# Patient Record
Sex: Female | Born: 1985 | Race: White | Hispanic: No | Marital: Married | Smoking: Former smoker
Health system: Southern US, Community
[De-identification: ages and names within clinical notes are randomized; demographics above are authoritative.]

## PROBLEM LIST (undated history)

## (undated) ENCOUNTER — Emergency Department: Disposition: A | Discharge status: Other Acute Inpatient Hospital | Payer: 59

## (undated) DIAGNOSIS — F419 Anxiety disorder, unspecified: Secondary | ICD-10-CM

## (undated) HISTORY — DX: Anxiety disorder, unspecified: F41.9

## (undated) HISTORY — PX: WISDOM TOOTH EXTRACTION: SHX21

## (undated) HISTORY — PX: NO PAST SURGERIES: SHX2092

---

## 2015-08-28 ENCOUNTER — Other Ambulatory Visit: Payer: Self-pay | Admitting: Internal Medicine

## 2015-08-28 ENCOUNTER — Encounter: Payer: Self-pay | Admitting: Internal Medicine

## 2015-08-28 DIAGNOSIS — Z920 Personal history of contraception: Secondary | ICD-10-CM | POA: Insufficient documentation

## 2015-08-28 DIAGNOSIS — G245 Blepharospasm: Secondary | ICD-10-CM | POA: Insufficient documentation

## 2015-08-28 DIAGNOSIS — G43909 Migraine, unspecified, not intractable, without status migrainosus: Secondary | ICD-10-CM | POA: Insufficient documentation

## 2015-08-28 DIAGNOSIS — E663 Overweight: Secondary | ICD-10-CM | POA: Insufficient documentation

## 2015-08-28 DIAGNOSIS — M79673 Pain in unspecified foot: Secondary | ICD-10-CM | POA: Insufficient documentation

## 2015-09-15 ENCOUNTER — Other Ambulatory Visit: Payer: Self-pay | Admitting: Internal Medicine

## 2016-04-27 ENCOUNTER — Other Ambulatory Visit: Payer: Self-pay | Admitting: Internal Medicine

## 2016-05-13 NOTE — Telephone Encounter (Signed)
Lm pt for pt to call back 7/20, unable to reach pt 8/2

## 2016-05-28 ENCOUNTER — Other Ambulatory Visit: Payer: Self-pay | Admitting: Internal Medicine

## 2016-06-08 ENCOUNTER — Encounter: Payer: Self-pay | Admitting: Internal Medicine

## 2016-06-08 ENCOUNTER — Ambulatory Visit (INDEPENDENT_AMBULATORY_CARE_PROVIDER_SITE_OTHER): Payer: 59 | Admitting: Internal Medicine

## 2016-06-08 VITALS — BP 122/88 | HR 88 | Resp 16 | Ht 67.0 in | Wt 244.0 lb

## 2016-06-08 DIAGNOSIS — G43009 Migraine without aura, not intractable, without status migrainosus: Secondary | ICD-10-CM | POA: Diagnosis not present

## 2016-06-08 DIAGNOSIS — M722 Plantar fascial fibromatosis: Secondary | ICD-10-CM | POA: Insufficient documentation

## 2016-06-08 MED ORDER — SUMATRIPTAN SUCCINATE 100 MG PO TABS: 100.0000 mg | ORAL_TABLET | Freq: Every day | ORAL | 5 refills | Status: DC | PRN

## 2016-06-08 MED ORDER — AMITRIPTYLINE HCL 10 MG PO TABS: 10.0000 mg | ORAL_TABLET | Freq: Every day | ORAL | 5 refills | Status: DC

## 2016-06-08 NOTE — Progress Notes (Signed)
Date:  06/08/2016   Name:  Laurie SayresZoe Floyd   DOB:  [birth year]   MRN:  [id number]   Chief Complaint: Foot Pain (L and R foot pain feels like Plantar issues. ) and Migraine (Has about 1 migraine or headache daily for last few weeks. Stress level has increased. ) Foot Pain  This is a new problem. The current episode started 1 to 4 weeks ago. The problem occurs daily. Associated symptoms include arthralgias (both feet; right > left) and headaches. Pertinent negatives include no abdominal pain, chest pain, congestion, coughing, diaphoresis, fatigue, fever or numbness.  Migraine   This is a chronic problem. The current episode started more than 1 year ago. The problem occurs intermittently. Progression since onset: recently more frequent. The pain quality is similar to prior headaches. Pertinent negatives include no abdominal pain, coughing, fever, numbness, photophobia, seizures or sinus pressure.  She never took imitrex.  She stopped Elavil but then resumed recently due to headaches.  Take high doses of Advil daily for HA.    Review of Systems  Constitutional: Negative for diaphoresis, fatigue and fever.  HENT: Negative for congestion and sinus pressure.   Eyes: Negative for photophobia and visual disturbance.  Respiratory: Negative for cough and shortness of breath.   Cardiovascular: Negative for chest pain, palpitations and leg swelling.  Gastrointestinal: Negative for abdominal pain.  Musculoskeletal: Positive for arthralgias (both feet; right > left).  Neurological: Positive for headaches. Negative for seizures, syncope and numbness.    Patient Active Problem List   Diagnosis Date Noted  . Migraine without aura and without status migrainosus, not intractable 06/08/2016  . 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 H/O contraceptive use 08/28/2015  . Overweight on examination 08/28/2015  . Foot pain 08/28/2015    Prior to Admission medications   Medication Sig Start Date End Date Taking?  Authorizing Provider  amitriptyline (ELAVIL) 10 MG tablet TAKE 1-2 TABLETS, ORAL, AT BEDTIME 04/27/16  Yes Reubin MilanLaura H Breiona Couvillon, MD  ibuprofen (IBU-200) 200 MG tablet Take 200 mg by mouth every 6 (six) hours as needed.   Yes Historical Provider, MD  NUVARING 0.12-0.015 MG/24HR vaginal ring USE VAGINALLY AS DIRECTED 09/15/15  Yes Reubin MilanLaura H Azula Zappia, MD    No Known Allergies  History reviewed. No pertinent surgical history.  Social History  Substance Use Topics  . Smoking status: Current Every Day Smoker  . Smokeless tobacco: Never Used  . Alcohol use 1.2 oz/week    2 Standard drinks or equivalent per week     Medication list has been reviewed and updated.   Physical Exam  Constitutional: She is oriented to person, place, and time. She appears well-developed. No distress.  HENT:  Head: Normocephalic and atraumatic.  Neck: Normal range of motion. Neck supple. No thyromegaly present.  Cardiovascular: Normal rate, regular rhythm and normal heart sounds.   Pulmonary/Chest: Effort normal and breath sounds normal. No respiratory distress.  Musculoskeletal: Normal range of motion.  Neurological: She is alert and oriented to person, place, and time. She has normal strength and normal reflexes. No cranial nerve deficit or sensory deficit.  Skin: Skin is warm and dry. No rash noted.  Psychiatric: She has a normal mood and affect. Her behavior is normal. Thought content normal.  Nursing note and vitals reviewed.   BP 122/88 (BP Location: Right Arm, Patient Position: Sitting, Cuff Size: Large)   Pulse 88   Resp 16   Ht 5\' 7"  (1.702 m)   Wt 244 lb (110.7 kg)  LMP 06/01/2016   BMI 38.22 kg/m   Assessment and Plan: 1. Migraine without aura and without status migrainosus, not intractable Take elavil daily Use imitrex for migraines Taper off advil Consider Neurology referral - amitriptyline (ELAVIL) 10 MG tablet; Take 1-2 tablets (10-20 mg total) by mouth at bedtime.  Dispense: 60 tablet;  Refill: 5 - SUMAtriptan (IMITREX) 100 MG tablet; Take 1 tablet (100 mg total) by mouth daily as needed.  Dispense: 12 tablet; Refill: 5  2. Bilateral plantar fasciitis Ice bottle massage daily as needed  Bari Edward, MD [facility]  06/08/2016

## 2016-06-08 NOTE — Patient Instructions (Signed)
Plantar Fasciitis Plantar fasciitis is a painful foot condition that affects the heel. It occurs when the band of tissue that connects the toes to the heel bone (plantar fascia) becomes irritated. This can happen after exercising too much or doing other repetitive activities (overuse injury). The pain from plantar fasciitis can range from mild irritation to severe pain that makes it difficult for you to walk or move. The pain is usually worse in the morning or after you have been sitting or lying down for a while. CAUSES This condition may be caused by:  Standing for long periods of time.  Wearing shoes that do not fit.  Doing high-impact activities, including running, aerobics, and ballet.  Being overweight.  Having an abnormal way of walking (gait).  Having tight calf muscles.  Having high arches in your feet.  Starting a new athletic activity. SYMPTOMS The main symptom of this condition is heel pain. Other symptoms include:  Pain that gets worse after activity or exercise.  Pain that is worse in the morning or after resting.  Pain that goes away after you walk for a few minutes. DIAGNOSIS This condition may be diagnosed based on your signs and symptoms. Your health care provider will also do a physical exam to check for:  A tender area on the bottom of your foot.  A high arch in your foot.  Pain when you move your foot.  Difficulty moving your foot. You may also need to have imaging studies to confirm the diagnosis. These can include:  X-rays.  Ultrasound.  MRI. TREATMENT  Treatment for plantar fasciitis depends on the severity of the condition. Your treatment may include:  Rest, ice, and over-the-counter pain medicines to manage your pain.  Exercises to stretch your calves and your plantar fascia.  A splint that holds your foot in a stretched, upward position while you sleep (night splint).  Physical therapy to relieve symptoms and prevent problems in the  future.  Cortisone injections to relieve severe pain.  Extracorporeal shock wave therapy (ESWT) to stimulate damaged plantar fascia with electrical impulses. It is often used as a last resort before surgery.  Surgery, if other treatments have not worked after 12 months. HOME CARE INSTRUCTIONS  Take medicines only as directed by your health care provider.  Avoid activities that cause pain.  Roll the bottom of your foot over a bag of ice or a bottle of cold water. Do this for 20 minutes, 3-4 times a day.  Perform simple stretches as directed by your health care provider.  Try wearing athletic shoes with air-sole or gel-sole cushions or soft shoe inserts.  Wear a night splint while sleeping, if directed by your health care provider.  Keep all follow-up appointments with your health care provider. PREVENTION   Do not perform exercises or activities that cause heel pain.  Consider finding low-impact activities if you continue to have problems.  Lose weight if you need to. The best way to prevent plantar fasciitis is to avoid the activities that aggravate your plantar fascia. SEEK MEDICAL CARE IF:  Your symptoms do not go away after treatment with home care measures.  Your pain gets worse.  Your pain affects your ability to move or do your daily activities.   This information is not intended to replace advice given to you by your health care provider. Make sure you discuss any questions you have with your health care provider.   Document Released: 06/23/2001 Document Revised: 06/19/2015 Document Reviewed: 08/08/2014 Elsevier   Interactive Patient Education 2016 [employer]  

## 2016-06-24 ENCOUNTER — Other Ambulatory Visit: Payer: Self-pay | Admitting: Internal Medicine

## 2016-07-06 ENCOUNTER — Other Ambulatory Visit: Payer: Self-pay | Admitting: Internal Medicine

## 2016-07-06 DIAGNOSIS — G43009 Migraine without aura, not intractable, without status migrainosus: Secondary | ICD-10-CM

## 2016-07-06 MED ORDER — AMITRIPTYLINE HCL 10 MG PO TABS: 10.0000 mg | ORAL_TABLET | Freq: Every day | ORAL | 1 refills | Status: DC

## 2016-07-10 ENCOUNTER — Other Ambulatory Visit: Payer: Self-pay | Admitting: Internal Medicine

## 2016-07-10 DIAGNOSIS — G43009 Migraine without aura, not intractable, without status migrainosus: Secondary | ICD-10-CM

## 2016-07-10 MED ORDER — AMITRIPTYLINE HCL 10 MG PO TABS: 10.0000 mg | ORAL_TABLET | Freq: Every day | ORAL | 1 refills | Status: DC

## 2016-07-13 ENCOUNTER — Other Ambulatory Visit: Payer: Self-pay | Admitting: Internal Medicine

## 2016-07-13 DIAGNOSIS — G43009 Migraine without aura, not intractable, without status migrainosus: Secondary | ICD-10-CM

## 2016-07-13 MED ORDER — AMITRIPTYLINE HCL 10 MG PO TABS: 10.0000 mg | ORAL_TABLET | Freq: Every day | ORAL | 1 refills | Status: DC

## 2016-07-15 NOTE — Telephone Encounter (Signed)
Left messages for pt, no return calls

## 2016-07-31 ENCOUNTER — Ambulatory Visit (INDEPENDENT_AMBULATORY_CARE_PROVIDER_SITE_OTHER): Payer: 59 | Admitting: Internal Medicine

## 2016-07-31 ENCOUNTER — Encounter: Payer: Self-pay | Admitting: Internal Medicine

## 2016-07-31 VITALS — BP 128/86 | HR 77 | Resp 16 | Ht 67.0 in | Wt 245.0 lb

## 2016-07-31 DIAGNOSIS — M62830 Muscle spasm of back: Secondary | ICD-10-CM

## 2016-07-31 DIAGNOSIS — F419 Anxiety disorder, unspecified: Secondary | ICD-10-CM | POA: Insufficient documentation

## 2016-07-31 DIAGNOSIS — F411 Generalized anxiety disorder: Secondary | ICD-10-CM | POA: Diagnosis not present

## 2016-07-31 DIAGNOSIS — G43009 Migraine without aura, not intractable, without status migrainosus: Secondary | ICD-10-CM

## 2016-07-31 MED ORDER — METHOCARBAMOL 500 MG PO TABS: 500.0000 mg | ORAL_TABLET | Freq: Four times a day (QID) | ORAL | 0 refills | Status: DC

## 2016-07-31 MED ORDER — ESCITALOPRAM OXALATE 10 MG PO TABS: 10.0000 mg | ORAL_TABLET | Freq: Every day | ORAL | 3 refills | Status: DC

## 2016-07-31 NOTE — Progress Notes (Signed)
Date:  07/31/2016   Name:  Laurie Floyd   DOB:  16-Jun-1986   MRN:  [id number]   Chief Complaint: Migraine (Having them daily. when took meds day or two ago she had rapid heart beat and it was pounding. ); Nausea; Fatigue (Having stress increase husband not working and mother living with them. ); and Diarrhea  Migraine   This is a recurrent problem. The current episode started 1 to 4 weeks ago. The problem occurs daily. The pain is located in the bilateral region. The pain does not radiate. The quality of the pain is described as aching and throbbing. Pertinent negatives include no coughing, dizziness or fever. The symptoms are aggravated by work. She has tried triptans for the symptoms. Her past medical history is significant for migraine headaches.  Diarrhea   Associated symptoms include headaches. Pertinent negatives include no chills, coughing or fever.  Muscle Pain  This is a new problem. The current episode started in the past 7 days. The pain is present in the neck, right shoulder and left shoulder. The pain is medium. Associated symptoms include diarrhea and headaches. Pertinent negatives include no chest pain, fatigue, fever or shortness of breath.  Anxiety  Presents for initial visit. The problem has been gradually worsening. Symptoms include decreased concentration, excessive worry, nervous/anxious behavior and palpitations. Patient reports no chest pain, dizziness, shortness of breath or suicidal ideas. Symptoms occur constantly. The severity of symptoms is interfering with daily activities. The symptoms are aggravated by work stress and family issues. The quality of sleep is good.   Past treatments include nothing.     Review of Systems  Constitutional: Negative for chills, fatigue, fever and unexpected weight change.  Respiratory: Negative for cough, chest tightness and shortness of breath.   Cardiovascular: Positive for palpitations. Negative for chest pain.    Gastrointestinal: Positive for diarrhea.  Neurological: Positive for headaches. Negative for dizziness.  Psychiatric/Behavioral: Positive for decreased concentration. Negative for dysphoric mood, sleep disturbance and suicidal ideas. The patient is nervous/anxious.     Patient Active Problem List   Diagnosis Date Noted  . Migraine without aura and without status migrainosus, not intractable 06/08/2016  . Bilateral plantar fasciitis 06/08/2016  . 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 H/O contraceptive use 08/28/2015  . Overweight on examination 08/28/2015    Prior to Admission medications   Medication Sig Start Date End Date Taking? Authorizing Provider  amitriptyline (ELAVIL) 10 MG tablet Take 1-2 tablets (10-20 mg total) by mouth at bedtime. 07/13/16  Yes Reubin Milan, MD  NUVARING 0.12-0.015 MG/24HR vaginal ring USE VAGINALLY AS DIRECTED 06/24/16  Yes Reubin Milan, MD  SUMAtriptan (IMITREX) 100 MG tablet Take 1 tablet (100 mg total) by mouth daily as needed. 06/08/16  Yes Reubin Milan, MD    No Known Allergies  History reviewed. No pertinent surgical history.  Social History  Substance Use Topics  . Smoking status: Current Every Day Smoker  . Smokeless tobacco: Never Used  . Alcohol use 1.2 oz/week    2 Standard drinks or equivalent per week     Medication list has been reviewed and updated.   Physical Exam  Constitutional: She is oriented to person, place, and time. She appears well-developed and well-nourished. No distress.  HENT:  Head: Normocephalic and atraumatic.  Neck: Normal range of motion. Neck supple. Muscular tenderness present.  Cardiovascular: Normal rate, regular rhythm and normal heart sounds.   Pulmonary/Chest: Effort normal and breath sounds normal. No respiratory distress.  Musculoskeletal: Normal range of motion.  Neurological: She is alert and oriented to person, place, and time. She has normal strength. Gait normal.  Skin: Skin is warm and dry.  No rash noted.  Psychiatric: Her speech is normal and behavior is normal. Thought content normal. Her mood appears anxious.  Nursing note and vitals reviewed.   BP 128/86   Pulse 77   Resp 16   Ht 5\' 7"  (1.702 m)   Wt 245 lb (111.1 kg)   LMP 07/03/2016   SpO2 99%   BMI 38.37 kg/m   Assessment and Plan: 1. Migraine without aura and without status migrainosus, not intractable Continue imitrex prn and elavil at hs  2. Generalized anxiety disorder Begin lexapro - call or email in several weeks to report progress - escitalopram (LEXAPRO) 10 MG tablet; Take 1 tablet (10 mg total) by mouth daily.  Dispense: 30 tablet; Refill: 3  3. Muscle spasm of back Use robaxin before bed along with heat as needed - methocarbamol (ROBAXIN) 500 MG tablet; Take 1 tablet (500 mg total) by mouth 4 (four) times daily.  Dispense: 30 tablet; Refill: 0   Bari EdwardLaura Wayden Schwertner, MD [facility]  07/31/2016

## 2016-08-28 ENCOUNTER — Other Ambulatory Visit: Payer: Self-pay | Admitting: Internal Medicine

## 2016-08-28 DIAGNOSIS — F411 Generalized anxiety disorder: Secondary | ICD-10-CM

## 2016-08-28 MED ORDER — ESCITALOPRAM OXALATE 10 MG PO TABS: 10.0000 mg | ORAL_TABLET | Freq: Every day | ORAL | 1 refills | Status: DC

## 2016-09-26 ENCOUNTER — Other Ambulatory Visit: Payer: Self-pay | Admitting: Internal Medicine

## 2016-09-28 ENCOUNTER — Telehealth: Payer: Self-pay

## 2016-09-28 NOTE — Telephone Encounter (Signed)
Left message to call. Need to know last PAP and who is OBGYN recvd refill for NUVA Ring.

## 2016-09-29 ENCOUNTER — Other Ambulatory Visit: Payer: Self-pay | Admitting: Internal Medicine

## 2016-09-29 DIAGNOSIS — M62830 Muscle spasm of back: Secondary | ICD-10-CM

## 2016-10-30 NOTE — Telephone Encounter (Signed)
pts coming in on 11/25/16 for her cpe with pap

## 2016-11-12 LAB — HM PAP SMEAR: HM Pap smear: NORMAL

## 2016-11-25 ENCOUNTER — Encounter: Payer: Self-pay | Admitting: Internal Medicine

## 2016-11-25 ENCOUNTER — Ambulatory Visit (INDEPENDENT_AMBULATORY_CARE_PROVIDER_SITE_OTHER): Payer: 59 | Admitting: Internal Medicine

## 2016-11-25 VITALS — BP 122/64 | HR 66 | Temp 97.8°F | Resp 12 | Ht 67.0 in | Wt 235.0 lb

## 2016-11-25 DIAGNOSIS — N761 Subacute and chronic vaginitis: Secondary | ICD-10-CM | POA: Diagnosis not present

## 2016-11-25 DIAGNOSIS — M62830 Muscle spasm of back: Secondary | ICD-10-CM

## 2016-11-25 DIAGNOSIS — Z0000 Encounter for general adult medical examination without abnormal findings: Secondary | ICD-10-CM | POA: Diagnosis not present

## 2016-11-25 DIAGNOSIS — F411 Generalized anxiety disorder: Secondary | ICD-10-CM | POA: Diagnosis not present

## 2016-11-25 DIAGNOSIS — G43009 Migraine without aura, not intractable, without status migrainosus: Secondary | ICD-10-CM

## 2016-11-25 DIAGNOSIS — Z124 Encounter for screening for malignant neoplasm of cervix: Secondary | ICD-10-CM

## 2016-11-25 LAB — POCT URINALYSIS DIPSTICK
Bilirubin, UA: NEGATIVE
GLUCOSE UA: NEGATIVE
LEUKOCYTES UA: NEGATIVE
NITRITE UA: NEGATIVE
SPEC GRAV UA: 1.025
UROBILINOGEN UA: 0.2
pH, UA: 6

## 2016-11-25 MED ORDER — METHOCARBAMOL 500 MG PO TABS: 500.0000 mg | ORAL_TABLET | Freq: Four times a day (QID) | ORAL | 1 refills | Status: DC

## 2016-11-25 MED ORDER — FLUCONAZOLE 100 MG PO TABS: 100.0000 mg | ORAL_TABLET | Freq: Every day | ORAL | 0 refills | Status: DC

## 2016-11-25 NOTE — Patient Instructions (Signed)
Breast Self-Awareness Introduction Breast self-awareness means being familiar with how your breasts look and feel. It involves checking your breasts regularly and reporting any changes to your health care provider. Practicing breast self-awareness is important. A change in your breasts can be a sign of a serious medical problem. Being familiar with how your breasts look and feel allows you to find any problems early, when treatment is more likely to be successful. All women should practice breast self-awareness, including women who have had breast implants. How to do a breast self-exam One way to learn what is normal for your breasts and whether your breasts are changing is to do a breast self-exam. To do a breast self-exam: Look for Changes  1. Remove all the clothing above your waist. 2. Stand in front of a mirror in a room with good lighting. 3. Put your hands on your hips. 4. Push your hands firmly downward. 5. Compare your breasts in the mirror. Look for differences between them (asymmetry), such as:  Differences in shape.  Differences in size.  Puckers, dips, and bumps in one breast and not the other. 6. Look at each breast for changes in your skin, such as:  Redness.  Scaly areas. 7. Look for changes in your nipples, such as:  Discharge.  Bleeding.  Dimpling.  Redness.  A change in position. Feel for Changes  Carefully feel your breasts for lumps and changes. It is best to do this while lying on your back on the floor and again while sitting or standing in the shower or tub with soapy water on your skin. Feel each breast in the following way:  Place the arm on the side of the breast you are examining above your head.  Feel your breast with the other hand.  Start in the nipple area and make  inch (2 cm) overlapping circles to feel your breast. Use the pads of your three middle fingers to do this. Apply light pressure, then medium pressure, then firm pressure. The light  pressure will allow you to feel the tissue closest to the skin. The medium pressure will allow you to feel the tissue that is a little deeper. The firm pressure will allow you to feel the tissue close to the ribs.  Continue the overlapping circles, moving downward over the breast until you feel your ribs below your breast.  Move one finger-width toward the center of the body. Continue to use the  inch (2 cm) overlapping circles to feel your breast as you move slowly up toward your collarbone.  Continue the up and down exam using all three pressures until you reach your armpit. Write Down What You Find  Write down what is normal for each breast and any changes that you find. Keep a written record with breast changes or normal findings for each breast. By writing this information down, you do not need to depend only on memory for size, tenderness, or location. Write down where you are in your menstrual cycle, if you are still menstruating. If you are having trouble noticing differences in your breasts, do not get discouraged. With time you will become more familiar with the variations in your breasts and more comfortable with the exam. How often should I examine my breasts? Examine your breasts every month. If you are breastfeeding, the best time to examine your breasts is after a feeding or after using a breast pump. If you menstruate, the best time to examine your breasts is 5-7 days after your   period is over. During your period, your breasts are lumpier, and it may be more difficult to notice changes. When should I see my health care provider? See your health care provider if you notice:  A change in shape or size of your breasts or nipples.  A change in the skin of your breast or nipples, such as a reddened or scaly area.  Unusual discharge from your nipples.  A lump or thick area that was not there before.  Pain in your breasts.  Anything that concerns you. This information is not  intended to replace advice given to you by your health care provider. Make sure you discuss any questions you have with your health care provider. Document Released: 09/28/2005 Document Revised: 03/05/2016 Document Reviewed: 08/18/2015  2017 Elsevier  

## 2016-11-25 NOTE — Progress Notes (Signed)
Date:  11/25/2016   Name:  Laurie SayresZoe Floyd   DOB:  1986-01-27   MRN:  [id number]   Chief Complaint: Annual Exam (w/ pap) Laurie Floyd is a 31 y.o. female who presents today for her Complete Annual Exam. She feels well. She reports exercising some. She reports she is sleeping well. She is currently on Keto diet and feels well.    Migraine   This is a recurrent problem. The problem occurs intermittently. The problem has been gradually improving. Associated symptoms include back pain. Pertinent negatives include no abdominal pain, coughing, dizziness, fever, hearing loss, tinnitus or vomiting. She has tried triptans for the symptoms. The treatment provided significant relief.  Back Pain  This is a recurrent problem. The problem occurs intermittently. The pain is present in the lumbar spine. The pain is mild. The symptoms are aggravated by twisting and bending. Pertinent negatives include no abdominal pain, chest pain, dysuria, fever or headaches. She has tried muscle relaxant for the symptoms. The treatment provided moderate relief.  Anxiety  Presents for follow-up visit. Patient reports no chest pain, dizziness, nervous/anxious behavior, palpitations or shortness of breath. Symptoms occur rarely (much improved since mother in law moved out).   Compliance with medications: stopped lexapro and is doing well.    Review of Systems  Constitutional: Negative for chills, fatigue and fever.  HENT: Negative for congestion, hearing loss, tinnitus, trouble swallowing and voice change.   Eyes: Negative for visual disturbance.  Respiratory: Negative for cough, chest tightness, shortness of breath and wheezing.   Cardiovascular: Negative for chest pain, palpitations and leg swelling.  Gastrointestinal: Negative for abdominal pain, constipation, diarrhea and vomiting.  Endocrine: Negative for polydipsia and polyuria.  Genitourinary: Negative for dyspareunia, dysuria, frequency, genital sores, menstrual  problem, vaginal bleeding and vaginal discharge (mild vaginal itching).  Musculoskeletal: Positive for back pain. Negative for arthralgias, gait problem and joint swelling.  Skin: Negative for color change and rash.  Neurological: Negative for dizziness, tremors, light-headedness and headaches.  Hematological: Negative for adenopathy. Does not bruise/bleed easily.  Psychiatric/Behavioral: Negative for dysphoric mood and sleep disturbance. The patient is not nervous/anxious.     Patient Active Problem List   Diagnosis Date Noted  . Anxiety disorder 07/31/2016  . Muscle spasm of back 07/31/2016  . Migraine without aura and without status migrainosus, not intractable 06/08/2016  . Bilateral plantar fasciitis 06/08/2016  . 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 H/O contraceptive use 08/28/2015  . Overweight on examination 08/28/2015    Prior to Admission medications   Medication Sig Start Date End Date Taking? Authorizing Provider  methocarbamol (ROBAXIN) 500 MG tablet TAKE 1 TABLET (500 MG TOTAL) BY MOUTH 4 (FOUR) TIMES DAILY. 09/30/16  Yes Reubin MilanLaura H Berglund, MD  NUVARING 0.12-0.015 MG/24HR vaginal ring USE VAGINALLY AS DIRECTED 09/29/16  Yes Reubin MilanLaura H Berglund, MD  SUMAtriptan (IMITREX) 100 MG tablet Take 1 tablet (100 mg total) by mouth daily as needed. 06/08/16  Yes Reubin MilanLaura H Berglund, MD  amitriptyline (ELAVIL) 10 MG tablet Take 1-2 tablets (10-20 mg total) by mouth at bedtime. Patient not taking: Reported on 11/25/2016 07/13/16   Reubin MilanLaura H Berglund, MD  escitalopram (LEXAPRO) 10 MG tablet Take 1 tablet (10 mg total) by mouth daily. Patient not taking: Reported on 11/25/2016 08/28/16   Reubin MilanLaura H Berglund, MD    No Known Allergies  History reviewed. No pertinent surgical history.  Social History  Substance Use Topics  . Smoking status: Current Every Day Smoker  . Smokeless tobacco: Never Used  .  Alcohol use 1.2 oz/week    2 Standard drinks or equivalent per week     Medication list has been  reviewed and updated.   Physical Exam  Constitutional: She is oriented to person, place, and time. She appears well-developed and well-nourished. No distress.  HENT:  Head: Normocephalic and atraumatic.  Right Ear: Tympanic membrane and ear canal normal.  Left Ear: Tympanic membrane and ear canal normal.  Nose: Right sinus exhibits no maxillary sinus tenderness. Left sinus exhibits no maxillary sinus tenderness.  Mouth/Throat: Uvula is midline and oropharynx is clear and moist.  Eyes: Conjunctivae and EOM are normal. Right eye exhibits no discharge. Left eye exhibits no discharge. No scleral icterus.  Neck: Normal range of motion. Carotid bruit is not present. No erythema present. No thyromegaly present.  Cardiovascular: Normal rate, regular rhythm, normal heart sounds and normal pulses.   Pulmonary/Chest: Effort normal. No respiratory distress. She has no wheezes. Right breast exhibits no mass, no nipple discharge, no skin change and no tenderness. Left breast exhibits no mass, no nipple discharge, no skin change and no tenderness.  Abdominal: Soft. Bowel sounds are normal. There is no hepatosplenomegaly. There is no tenderness. There is no CVA tenderness.  Genitourinary: Uterus normal. There is no tenderness, lesion or injury on the right labia. There is no tenderness, lesion or injury on the left labia. Cervix exhibits no motion tenderness and no discharge. Right adnexum displays no mass, no tenderness and no fullness. Left adnexum displays no mass, no tenderness and no fullness. No signs of injury around the vagina. No vaginal discharge found.  Musculoskeletal: Normal range of motion.       Lumbar back: She exhibits tenderness and spasm.  Lymphadenopathy:    She has no cervical adenopathy.    She has no axillary adenopathy.  Neurological: She is alert and oriented to person, place, and time. She has normal reflexes. No cranial nerve deficit or sensory deficit.  Skin: Skin is warm, dry and  intact. No rash noted.     Psychiatric: She has a normal mood and affect. Her speech is normal and behavior is normal. Thought content normal.  Nursing note and vitals reviewed.   BP 122/64   Pulse 66   Temp 97.8 F (36.6 C)   Resp 12   Ht 5\' 7"  (1.702 m)   Wt 235 lb (106.6 kg)   LMP 11/12/2016 (Within Weeks)   BMI 36.81 kg/m   Assessment and Plan: 1. Annual physical exam Continue diet and weight loss - CBC with Differential/Platelet - Comprehensive metabolic panel - POCT urinalysis dipstick - TSH - Lipid panel  2. Papanicolaou smear for cervical cancer screening - Pap IG and HPV (high risk) DNA detection  3. Migraine without aura and without status migrainosus, not intractable Improved - call for imitrex refill when needed  4. Generalized anxiety disorder Much improved with change in living situation Remain off of medication for now - TSH  5. Muscle spasm of back - methocarbamol (ROBAXIN) 500 MG tablet; Take 1 tablet (500 mg total) by mouth 4 (four) times daily.  Dispense: 120 tablet; Refill: 1  6. Subacute vaginitis - fluconazole (DIFLUCAN) 100 MG tablet; Take 1 tablet (100 mg total) by mouth daily.  Dispense: 1 tablet; Refill: 0   Bari Edward, MD [facility]  11/25/2016

## 2016-11-26 LAB — CBC WITH DIFFERENTIAL/PLATELET
Basophils Absolute: 0 10*3/uL (ref 0.0–0.2)
Basos: 0 %
EOS (ABSOLUTE): 0.2 10*3/uL (ref 0.0–0.4)
EOS: 2 %
HEMATOCRIT: 41.5 % (ref 34.0–46.6)
HEMOGLOBIN: 13.6 g/dL (ref 11.1–15.9)
Immature Grans (Abs): 0 10*3/uL (ref 0.0–0.1)
Immature Granulocytes: 0 %
LYMPHS ABS: 2.4 10*3/uL (ref 0.7–3.1)
Lymphs: 26 %
MCH: 29.4 pg (ref 26.6–33.0)
MCHC: 32.8 g/dL (ref 31.5–35.7)
MCV: 90 fL (ref 79–97)
MONOS ABS: 0.5 10*3/uL (ref 0.1–0.9)
Monocytes: 5 %
NEUTROS ABS: 6.3 10*3/uL (ref 1.4–7.0)
Neutrophils: 67 %
Platelets: 194 10*3/uL (ref 150–379)
RBC: 4.63 x10E6/uL (ref 3.77–5.28)
RDW: 13.9 % (ref 12.3–15.4)
WBC: 9.4 10*3/uL (ref 3.4–10.8)

## 2016-11-26 LAB — COMPREHENSIVE METABOLIC PANEL
A/G RATIO: 1.6 (ref 1.2–2.2)
ALBUMIN: 4.3 g/dL (ref 3.5–5.5)
ALK PHOS: 54 IU/L (ref 39–117)
ALT: 17 IU/L (ref 0–32)
AST: 17 IU/L (ref 0–40)
BILIRUBIN TOTAL: 0.3 mg/dL (ref 0.0–1.2)
BUN / CREAT RATIO: 10 (ref 9–23)
BUN: 9 mg/dL (ref 6–20)
CO2: 17 mmol/L — AB (ref 18–29)
Calcium: 9.4 mg/dL (ref 8.7–10.2)
Chloride: 103 mmol/L (ref 96–106)
Creatinine, Ser: 0.94 mg/dL (ref 0.57–1.00)
GFR calc Af Amer: 95 mL/min/{1.73_m2} (ref >59)
GFR calc non Af Amer: 83 mL/min/{1.73_m2} (ref >59)
GLOBULIN, TOTAL: 2.7 g/dL (ref 1.5–4.5)
Glucose: 88 mg/dL (ref 65–99)
POTASSIUM: 4.5 mmol/L (ref 3.5–5.2)
SODIUM: 144 mmol/L (ref 134–144)
Total Protein: 7 g/dL (ref 6.0–8.5)

## 2016-11-26 LAB — LIPID PANEL
CHOLESTEROL TOTAL: 154 mg/dL (ref 100–199)
Chol/HDL Ratio: 3.2 ratio units (ref 0.0–4.4)
HDL: 48 mg/dL (ref >39)
LDL Calculated: 81 mg/dL (ref 0–99)
TRIGLYCERIDES: 125 mg/dL (ref 0–149)
VLDL Cholesterol Cal: 25 mg/dL (ref 5–40)

## 2016-11-26 LAB — TSH: TSH: 1.08 u[IU]/mL (ref 0.450–4.500)

## 2016-11-27 LAB — CBC WITH DIFFERENTIAL/PLATELET
BASOS ABS: 0 10*3/uL (ref 0.0–0.2)
BASOS: 0 %
EOS (ABSOLUTE): 0.2 10*3/uL (ref 0.0–0.4)
Eos: 3 %
HEMOGLOBIN: 14.7 g/dL (ref 11.1–15.9)
Hematocrit: 42 % (ref 34.0–46.6)
IMMATURE GRANS (ABS): 0 10*3/uL (ref 0.0–0.1)
Immature Granulocytes: 1 %
LYMPHS: 10 %
Lymphocytes Absolute: 0.5 10*3/uL — ABNORMAL LOW (ref 0.7–3.1)
MCH: 30 pg (ref 26.6–33.0)
MCHC: 35 g/dL (ref 31.5–35.7)
MCV: 86 fL (ref 79–97)
MONOCYTES: 10 %
Monocytes Absolute: 0.5 10*3/uL (ref 0.1–0.9)
NEUTROS ABS: 4.3 10*3/uL (ref 1.4–7.0)
Neutrophils: 76 %
Platelets: 161 10*3/uL (ref 150–379)
RBC: 4.9 x10E6/uL (ref 3.77–5.28)
RDW: 13.8 % (ref 12.3–15.4)
WBC: 5.5 10*3/uL (ref 3.4–10.8)

## 2016-11-27 LAB — COMPREHENSIVE METABOLIC PANEL
ALT: 24 IU/L (ref 0–32)
AST: 14 IU/L (ref 0–40)
Albumin/Globulin Ratio: 2.1 (ref 1.2–2.2)
Albumin: 4.4 g/dL (ref 3.5–5.5)
Alkaline Phosphatase: 52 IU/L (ref 39–117)
BILIRUBIN TOTAL: 0.8 mg/dL (ref 0.0–1.2)
BUN / CREAT RATIO: 25 — AB (ref 9–23)
BUN: 26 mg/dL — AB (ref 6–20)
CHLORIDE: 102 mmol/L (ref 96–106)
CO2: 28 mmol/L (ref 18–29)
CREATININE: 1.05 mg/dL — AB (ref 0.57–1.00)
Calcium: 9.4 mg/dL (ref 8.7–10.2)
GFR, EST AFRICAN AMERICAN: 84 mL/min/{1.73_m2} (ref >59)
GFR, EST NON AFRICAN AMERICAN: 72 mL/min/{1.73_m2} (ref >59)
GLUCOSE: 118 mg/dL — AB (ref 65–99)
Globulin, Total: 2.1 g/dL (ref 1.5–4.5)
Potassium: 4.9 mmol/L (ref 3.5–5.2)
Sodium: 144 mmol/L (ref 134–144)
Total Protein: 6.5 g/dL (ref 6.0–8.5)

## 2016-11-27 LAB — LIPID PANEL
CHOLESTEROL TOTAL: 186 mg/dL (ref 100–199)
Chol/HDL Ratio: 3.6 ratio units (ref 0.0–4.4)
HDL: 52 mg/dL (ref >39)
LDL CALC: 112 mg/dL — AB (ref 0–99)
TRIGLYCERIDES: 109 mg/dL (ref 0–149)
VLDL CHOLESTEROL CAL: 22 mg/dL (ref 5–40)

## 2016-11-27 LAB — PAP IG AND HPV HIGH-RISK
HPV, HIGH-RISK: NEGATIVE
PAP SMEAR COMMENT: 0

## 2016-11-27 LAB — TSH: TSH: 2.9 u[IU]/mL (ref 0.450–4.500)

## 2016-12-01 LAB — SPECIMEN STATUS REPORT

## 2016-12-01 LAB — HGB A1C W/O EAG: HEMOGLOBIN A1C: 5.5 % (ref 4.8–5.6)

## 2016-12-29 ENCOUNTER — Other Ambulatory Visit: Payer: Self-pay | Admitting: Internal Medicine

## 2017-02-17 ENCOUNTER — Other Ambulatory Visit: Payer: Self-pay | Admitting: Internal Medicine

## 2017-02-17 DIAGNOSIS — M62830 Muscle spasm of back: Secondary | ICD-10-CM

## 2017-04-21 ENCOUNTER — Encounter: Payer: Self-pay | Admitting: Internal Medicine

## 2017-04-21 ENCOUNTER — Ambulatory Visit (INDEPENDENT_AMBULATORY_CARE_PROVIDER_SITE_OTHER): Payer: 59 | Admitting: Internal Medicine

## 2017-04-21 VITALS — BP 140/64 | HR 77 | Ht 67.0 in | Wt 215.8 lb

## 2017-04-21 DIAGNOSIS — N912 Amenorrhea, unspecified: Secondary | ICD-10-CM

## 2017-04-21 DIAGNOSIS — G43009 Migraine without aura, not intractable, without status migrainosus: Secondary | ICD-10-CM

## 2017-04-21 DIAGNOSIS — Z3A01 Less than 8 weeks gestation of pregnancy: Secondary | ICD-10-CM | POA: Diagnosis not present

## 2017-04-21 LAB — POCT URINE PREGNANCY: Preg Test, Ur: POSITIVE — AB

## 2017-04-21 NOTE — Progress Notes (Signed)
Date:  04/21/2017   Name:  Laurie Floyd   DOB:  1986/08/23   MRN:  [id number]   Chief Complaint: Possible Pregnancy (Took two tests 3 days ago and were positive. - having nausea, back pain, and and breast tenderness.) Possible Pregnancy  This is a new problem. The current episode started in the past 7 days. Associated symptoms include fatigue and nausea. Pertinent negatives include no abdominal pain, chest pain, chills, diaphoresis, fever, headaches or vomiting.  She has been using Nuva-Ring but may have been a week late inserting it last month.  When she missed her period and her breasts became sore, she took home tests that were all positive. She quit smoking 2 months ago.  Is currently vaping zero nicotine.  She has not taken any Imitrex in several months.  She was using robaxin for low back but has stopped. She has three previous pregnancies, none complicated other than mild HTN at the end of her last pregnancy.      Review of Systems  Constitutional: Positive for fatigue. Negative for chills, diaphoresis and fever.  Respiratory: Negative for chest tightness, shortness of breath and wheezing.   Cardiovascular: Negative for chest pain, palpitations and leg swelling.  Gastrointestinal: Positive for nausea. Negative for abdominal pain and vomiting.  Genitourinary: Negative for dysuria.  Musculoskeletal: Positive for back pain (low back ache).  Neurological: Negative for headaches.  Psychiatric/Behavioral: Positive for sleep disturbance.    Patient Active Problem List   Diagnosis Date Noted  . Anxiety disorder 07/31/2016  . Muscle spasm of back 07/31/2016  . Migraine without aura and without status migrainosus, not intractable 06/08/2016  . Bilateral plantar fasciitis 06/08/2016  . 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 H/O contraceptive use 08/28/2015  . Overweight on examination 08/28/2015    Prior to Admission medications   Medication Sig Start Date End Date Taking? Authorizing  Provider  SUMAtriptan (IMITREX) 100 MG tablet Take 1 tablet (100 mg total) by mouth daily as needed. Patient not taking: Reported on 04/21/2017 06/08/16   Reubin MilanBerglund, Kalyani Maeda H, MD    No Known Allergies  No past surgical history on file.  Social History  Substance Use Topics  . Smoking status: Current Every Day Smoker  . Smokeless tobacco: Never Used  . Alcohol use 1.2 oz/week    2 Standard drinks or equivalent per week     Medication list has been reviewed and updated.   Physical Exam  Constitutional: She is oriented to person, place, and time. She appears well-developed. No distress.  HENT:  Head: Normocephalic and atraumatic.  Neck: Normal range of motion. Neck supple. Carotid bruit is not present.  Cardiovascular: Normal rate, regular rhythm and normal heart sounds.   Pulmonary/Chest: Effort normal and breath sounds normal. No respiratory distress. She has no wheezes.  Musculoskeletal: Normal range of motion.  Neurological: She is alert and oriented to person, place, and time.  Skin: Skin is warm and dry. No rash noted.  Psychiatric: She has a normal mood and affect. Her speech is normal and behavior is normal. Thought content normal.  Nursing note and vitals reviewed.   BP 140/64   Pulse 77   Ht 5\' 7"  (1.702 m)   Wt 215 lb 12.8 oz (97.9 kg)   LMP 04/08/2017 (Exact Date)   SpO2 99%   BMI 33.80 kg/m   Assessment and Plan: 1. Amenorrhea Test positive; LMP ~ 03/10/17 - POCT urine pregnancy  2. Less than [redacted] weeks gestation of pregnancy Stop all prescription  medications May take tylenol for HA or pain Begin Prenatal vitamin daily - Ambulatory referral to Obstetrics / Gynecology  3. Migraine without aura and without status migrainosus, not intractable Stop imitrex; may take tylenol as needed   No orders of the defined types were placed in this encounter.   Bari Edward, MD [facility]  04/21/2017

## 2017-04-21 NOTE — Patient Instructions (Addendum)
Begin Prenatal vitamins with Folic acid  Tylenol and heat for low back pain

## 2017-06-08 ENCOUNTER — Encounter: Payer: Self-pay | Admitting: Certified Nurse Midwife

## 2017-06-08 ENCOUNTER — Ambulatory Visit (INDEPENDENT_AMBULATORY_CARE_PROVIDER_SITE_OTHER): Payer: 59 | Admitting: Certified Nurse Midwife

## 2017-06-08 VITALS — BP 114/90 | HR 88 | Wt 222.7 lb

## 2017-06-08 DIAGNOSIS — R519 Headache, unspecified: Secondary | ICD-10-CM

## 2017-06-08 DIAGNOSIS — O9989 Other specified diseases and conditions complicating pregnancy, childbirth and the puerperium: Secondary | ICD-10-CM

## 2017-06-08 DIAGNOSIS — O99891 Other specified diseases and conditions complicating pregnancy: Secondary | ICD-10-CM

## 2017-06-08 DIAGNOSIS — M549 Dorsalgia, unspecified: Secondary | ICD-10-CM

## 2017-06-08 DIAGNOSIS — O26892 Other specified pregnancy related conditions, second trimester: Secondary | ICD-10-CM

## 2017-06-08 DIAGNOSIS — E669 Obesity, unspecified: Secondary | ICD-10-CM

## 2017-06-08 DIAGNOSIS — O09299 Supervision of pregnancy with other poor reproductive or obstetric history, unspecified trimester: Secondary | ICD-10-CM

## 2017-06-08 DIAGNOSIS — Z3482 Encounter for supervision of other normal pregnancy, second trimester: Secondary | ICD-10-CM

## 2017-06-08 DIAGNOSIS — Z113 Encounter for screening for infections with a predominantly sexual mode of transmission: Secondary | ICD-10-CM

## 2017-06-08 DIAGNOSIS — R51 Headache: Secondary | ICD-10-CM

## 2017-06-08 DIAGNOSIS — O0930 Supervision of pregnancy with insufficient antenatal care, unspecified trimester: Secondary | ICD-10-CM

## 2017-06-08 DIAGNOSIS — Z1389 Encounter for screening for other disorder: Secondary | ICD-10-CM

## 2017-06-08 MED ORDER — MAGNESIUM OXIDE -MG SUPPLEMENT 400 (240 MG) MG PO TABS: 1.0000 | ORAL_TABLET | Freq: Two times a day (BID) | ORAL | 5 refills | Status: DC

## 2017-06-08 MED ORDER — ASPIRIN EC 81 MG PO TBEC: 81.0000 mg | DELAYED_RELEASE_TABLET | Freq: Every day | ORAL | 2 refills | Status: DC

## 2017-06-08 NOTE — Patient Instructions (Signed)
Hypertension During Pregnancy Hypertension, commonly called high blood pressure, is when the force of blood pumping through your arteries is too strong. Arteries are blood vessels that carry blood from the heart throughout the body. Hypertension during pregnancy can cause problems for you and your baby. Your baby may be born early (prematurely) or may not weigh as much as he or she should at birth. Very bad cases of hypertension during pregnancy can be life-threatening. Different types of hypertension can occur during pregnancy. These include:  Chronic hypertension. This happens when: ? You have hypertension before pregnancy and it continues during pregnancy. ? You develop hypertension before you are [redacted] weeks pregnant, and it continues during pregnancy.  Gestational hypertension. This is hypertension that develops after the 20th week of pregnancy.  Preeclampsia, also called toxemia of pregnancy. This is a very serious type of hypertension that develops only during pregnancy. It affects the whole body, and it can be very dangerous for you and your baby.  Gestational hypertension and preeclampsia usually go away within 6 weeks after your baby is born. Women who have hypertension during pregnancy have a greater chance of developing hypertension later in life or during future pregnancies. What are the causes? The exact cause of hypertension is not known. What increases the risk? There are certain factors that make it more likely for you to develop hypertension during pregnancy. These include:  Having hypertension during a previous pregnancy or prior to pregnancy.  Being overweight.  Being older than age 107.  Being pregnant for the first time or being pregnant with more than one baby.  Becoming pregnant using fertilization methods such as IVF (in vitro fertilization).  Having diabetes, kidney problems, or systemic lupus erythematosus.  Having a family history of hypertension.  What are the  signs or symptoms? Chronic hypertension and gestational hypertension rarely cause symptoms. Preeclampsia causes symptoms, which may include:  Increased protein in your urine. Your health care provider will check for this at every visit before you give birth (prenatal visit).  Severe headaches.  Sudden weight gain.  Swelling of the hands, face, legs, and feet.  Nausea and vomiting.  Vision problems, such as blurred or double vision.  Numbness in the face, arms, legs, and feet.  Dizziness.  Slurred speech.  Sensitivity to bright lights.  Abdominal pain.  Convulsions.  How is this diagnosed? You may be diagnosed with hypertension during a routine prenatal exam. At each prenatal visit, you may:  Have a urine test to check for high amounts of protein in your urine.  Have your blood pressure checked. A blood pressure reading is recorded as two numbers, such as "120 over 80" (or 120/80). The first ("top") number is called the systolic pressure. It is a measure of the pressure in your arteries when your heart beats. The second ("bottom") number is called the diastolic pressure. It is a measure of the pressure in your arteries as your heart relaxes between beats. Blood pressure is measured in a unit called mm Hg. A normal blood pressure reading is: ? Systolic: below 235. ? Diastolic: below 80.  The type of hypertension that you are diagnosed with depends on your test results and when your symptoms developed.  Chronic hypertension is usually diagnosed before 20 weeks of pregnancy.  Gestational hypertension is usually diagnosed after 20 weeks of pregnancy.  Hypertension with high amounts of protein in the urine is diagnosed as preeclampsia.  Blood pressure measurements that stay above 573 systolic, or above 220 diastolic, are  signs of severe preeclampsia.  How is this treated? Treatment for hypertension during pregnancy varies depending on the type of hypertension you have and how  serious it is.  If you take medicines called ACE inhibitors to treat chronic hypertension, you may need to switch medicines. ACE inhibitors should not be taken during pregnancy.  If you have gestational hypertension, you may need to take blood pressure medicine.  If you are at risk for preeclampsia, your health care provider may recommend that you take a low-dose aspirin every day to prevent high blood pressure during your pregnancy.  If you have severe preeclampsia, you may need to be hospitalized so you and your baby can be monitored closely. You may also need to take medicine (magnesium sulfate) to prevent seizures and to lower blood pressure. This medicine may be given as an injection or through an IV tube.  In some cases, if your condition gets worse, you may need to deliver your baby early.  Follow these instructions at home: Eating and drinking  Drink enough fluid to keep your urine clear or pale yellow.  Eat a healthy diet that is low in salt (sodium). Do not add salt to your food. Check food labels to see how much sodium a food or beverage contains. Lifestyle  Do not use any products that contain nicotine or tobacco, such as cigarettes and e-cigarettes. If you need help quitting, ask your health care provider.  Do not use alcohol.  Avoid caffeine.  Avoid stress as much as possible. Rest and get plenty of sleep. General instructions  Take over-the-counter and prescription medicines only as told by your health care provider.  While lying down, lie on your left side. This keeps pressure off your baby.  While sitting or lying down, raise (elevate) your feet. Try putting some pillows under your lower legs.  Exercise regularly. Ask your health care provider what kinds of exercise are best for you.  Keep all prenatal and follow-up visits as told by your health care provider. This is important. Contact a health care provider if:  You have symptoms that your health care  provider told you may require more treatment or monitoring, such as: ? Fever. ? Vomiting. ? Headache. Get help right away if:  You have severe abdominal pain or vomiting that does not get better with treatment.  You suddenly develop swelling in your hands, ankles, or face.  You gain 4 lbs (1.8 kg) or more in 1 week.  You develop vaginal bleeding, or you have blood in your urine.  You do not feel your baby moving as much as usual.  You have blurred or double vision.  You have muscle twitching or sudden tightening (spasms).  You have shortness of breath.  Your lips or fingernails turn blue. This information is not intended to replace advice given to you by your health care provider. Make sure you discuss any questions you have with your health care provider. Document Released: 06/16/2011 Document Revised: 04/17/2016 Document Reviewed: 03/13/2016 Elsevier Interactive Patient Education  2018 [place] of Pregnancy The second trimester is from week 14 through week 27 (months 4 through 6). The second trimester is often a time when you feel your best. Your body has adjusted to being pregnant, and you begin to feel better physically. Usually, morning sickness has lessened or quit completely, you may have more energy, and you may have an increase in appetite. The second trimester is also a time when the fetus is growing  rapidly. At the end of the sixth month, the fetus is about 9 inches long and weighs about 1 pounds. You will likely begin to feel the baby move (quickening) between 16 and 20 weeks of pregnancy. Body changes during your second trimester Your body continues to go through many changes during your second trimester. The changes vary from woman to woman.  Your weight will continue to increase. You will notice your lower abdomen bulging out.  You may begin to get stretch marks on your hips, abdomen, and breasts.  You may develop headaches that can be  relieved by medicines. The medicines should be approved by your health care provider.  You may urinate more often because the fetus is pressing on your bladder.  You may develop or continue to have heartburn as a result of your pregnancy.  You may develop constipation because certain hormones are causing the muscles that push waste through your intestines to slow down.  You may develop hemorrhoids or swollen, bulging veins (varicose veins).  You may have back pain. This is caused by: ? Weight gain. ? Pregnancy hormones that are relaxing the joints in your pelvis. ? A shift in weight and the muscles that support your balance.  Your breasts will continue to grow and they will continue to become tender.  Your gums may bleed and may be sensitive to brushing and flossing.  Dark spots or blotches (chloasma, mask of pregnancy) may develop on your face. This will likely fade after the baby is born.  A dark line from your belly button to the pubic area (linea nigra) may appear. This will likely fade after the baby is born.  You may have changes in your hair. These can include thickening of your hair, rapid growth, and changes in texture. Some women also have hair loss during or after pregnancy, or hair that feels dry or thin. Your hair will most likely return to normal after your baby is born.  What to expect at prenatal visits During a routine prenatal visit:  You will be weighed to make sure you and the fetus are growing normally.  Your blood pressure will be taken.  Your abdomen will be measured to track your baby's growth.  The fetal heartbeat will be listened to.  Any test results from the previous visit will be discussed.  Your health care provider may ask you:  How you are feeling.  If you are feeling the baby move.  If you have had any abnormal symptoms, such as leaking fluid, bleeding, severe headaches, or abdominal cramping.  If you are using any tobacco products,  including cigarettes, chewing tobacco, and electronic cigarettes.  If you have any questions.  Other tests that may be performed during your second trimester include:  Blood tests that check for: ? Low iron levels (anemia). ? High blood sugar that affects pregnant women (gestational diabetes) between 63 and 28 weeks. ? Rh antibodies. This is to check for a protein on red blood cells (Rh factor).  Urine tests to check for infections, diabetes, or protein in the urine.  An ultrasound to confirm the proper growth and development of the baby.  An amniocentesis to check for possible genetic problems.  Fetal screens for spina bifida and Down syndrome.  HIV (human immunodeficiency virus) testing. Routine prenatal testing includes screening for HIV, unless you choose not to have this test.  Follow these instructions at home: Medicines  Follow your health care provider's instructions regarding medicine use. Specific medicines may  be either safe or unsafe to take during pregnancy.  Take a prenatal vitamin that contains at least 600 micrograms (mcg) of folic acid.  If you develop constipation, try taking a stool softener if your health care provider approves. Eating and drinking  Eat a balanced diet that includes fresh fruits and vegetables, whole grains, good sources of protein such as meat, eggs, or tofu, and low-fat dairy. Your health care provider will help you determine the amount of weight gain that is right for you.  Avoid raw meat and uncooked cheese. These carry germs that can cause birth defects in the baby.  If you have low calcium intake from food, talk to your health care provider about whether you should take a daily calcium supplement.  Limit foods that are high in fat and processed sugars, such as fried and sweet foods.  To prevent constipation: ? Drink enough fluid to keep your urine clear or pale yellow. ? Eat foods that are high in fiber, such as fresh fruits and  vegetables, whole grains, and beans. Activity  Exercise only as directed by your health care provider. Most women can continue their usual exercise routine during pregnancy. Try to exercise for 30 minutes at least 5 days a week. Stop exercising if you experience uterine contractions.  Avoid heavy lifting, wear low heel shoes, and practice good posture.  A sexual relationship may be continued unless your health care provider directs you otherwise. Relieving pain and discomfort  Wear a good support bra to prevent discomfort from breast tenderness.  Take warm sitz baths to soothe any pain or discomfort caused by hemorrhoids. Use hemorrhoid cream if your health care provider approves.  Rest with your legs elevated if you have leg cramps or low back pain.  If you develop varicose veins, wear support hose. Elevate your feet for 15 minutes, 3-4 times a day. Limit salt in your diet. Prenatal Care  Write down your questions. Take them to your prenatal visits.  Keep all your prenatal visits as told by your health care provider. This is important. Safety  Wear your seat belt at all times when driving.  Make a list of emergency phone numbers, including numbers for family, friends, the hospital, and police and fire departments. General instructions  Ask your health care provider for a referral to a local prenatal education class. Begin classes no later than the beginning of month 6 of your pregnancy.  Ask for help if you have counseling or nutritional needs during pregnancy. Your health care provider can offer advice or refer you to specialists for help with various needs.  Do not use hot tubs, steam rooms, or saunas.  Do not douche or use tampons or scented sanitary pads.  Do not cross your legs for long periods of time.  Avoid cat litter boxes and soil used by cats. These carry germs that can cause birth defects in the baby and possibly loss of the fetus by miscarriage or  stillbirth.  Avoid all smoking, herbs, alcohol, and unprescribed drugs. Chemicals in these products can affect the formation and growth of the baby.  Do not use any products that contain nicotine or tobacco, such as cigarettes and e-cigarettes. If you need help quitting, ask your health care provider.  Visit your dentist if you have not gone yet during your pregnancy. Use a soft toothbrush to brush your teeth and be gentle when you floss. Contact a health care provider if:  You have dizziness.  You have mild pelvic  cramps, pelvic pressure, or nagging pain in the abdominal area.  You have persistent nausea, vomiting, or diarrhea.  You have a bad smelling vaginal discharge.  You have pain when you urinate. Get help right away if:  You have a fever.  You are leaking fluid from your vagina.  You have spotting or bleeding from your vagina.  You have severe abdominal cramping or pain.  You have rapid weight gain or weight loss.  You have shortness of breath with chest pain.  You notice sudden or extreme swelling of your face, hands, ankles, feet, or legs.  You have not felt your baby move in over an hour.  You have severe headaches that do not go away when you take medicine.  You have vision changes. Summary  The second trimester is from week 14 through week 27 (months 4 through 6). It is also a time when the fetus is growing rapidly.  Your body goes through many changes during pregnancy. The changes vary from woman to woman.  Avoid all smoking, herbs, alcohol, and unprescribed drugs. These chemicals affect the formation and growth your baby.  Do not use any tobacco products, such as cigarettes, chewing tobacco, and e-cigarettes. If you need help quitting, ask your health care provider.  Contact your health care provider if you have any questions. Keep all prenatal visits as told by your health care provider. This is important. This information is not intended to replace  advice given to you by your health care provider. Make sure you discuss any questions you have with your health care provider. Document Released: 09/22/2001 Document Revised: 03/05/2016 Document Reviewed: 11/29/2012 Elsevier Interactive Patient Education  2017 [place]. Common Medications Safe in Pregnancy  Acne:      Constipation:  Benzoyl Peroxide     Colace  Clindamycin      Dulcolax Suppository  Topica Erythromycin     Fibercon  Salicylic Acid      Metamucil         Miralax AVOID:        Senakot   Accutane    Cough:  Retin-A       Cough Drops  Tetracycline      Phenergan w/ Codeine if Rx  Minocycline      Robitussin (Plain & DM)  Antibiotics:     Crabs/Lice:  Ceclor       RID  Cephalosporins    AVOID:  E-Mycins      Kwell  Keflex  Macrobid/Macrodantin   Diarrhea:  Penicillin      Kao-Pectate  Zithromax      Imodium AD         PUSH FLUIDS AVOID:       Cipro     Fever:  Tetracycline      Tylenol (Regular or Extra  Minocycline       Strength)  Levaquin      Extra Strength-Do not          Exceed 8 tabs/24 hrs Caffeine:        '200mg'$ /day (equiv. To 1 cup of coffee or  approx. 3 12 oz sodas)         Gas: Cold/Hayfever:       Gas-X  Benadryl      Mylicon  Claritin       Phazyme  **Claritin-D        Chlor-Trimeton    Headaches:  Dimetapp      ASA-Free Excedrin  Drixoral-Non-Drowsy     Cold  Compress  Mucinex (Guaifenasin)     Tylenol (Regular or Extra  Sudafed/Sudafed-12 Hour     Strength)  **Sudafed PE Pseudoephedrine   Tylenol Cold & Sinus     Vicks Vapor Rub  Zyrtec  **AVOID if Problems With Blood Pressure         Heartburn: Avoid lying down for at least 1 hour after meals  Aciphex      Maalox     Rash:  Milk of Magnesia     Benadryl    Mylanta       1% Hydrocortisone Cream  Pepcid  Pepcid Complete   Sleep Aids:  Prevacid      Ambien   Prilosec       Benadryl  Rolaids       Chamomile Tea  Tums (Limit 4/day)     Unisom  Zantac       Tylenol  PM         Warm milk-add vanilla or  Hemorrhoids:       Sugar for taste  Anusol/Anusol H.C.  (RX: Analapram 2.5%)  Sugar Substitutes:  Hydrocortisone OTC     Ok in moderation  Preparation H      Tucks        Vaseline lotion applied to tissue with wiping    Herpes:     Throat:  Acyclovir      Oragel  Famvir  Valtrex     Vaccines:         Flu Shot Leg Cramps:       *Gardasil  Benadryl      Hepatitis A         Hepatitis B Nasal Spray:       Pneumovax  Saline Nasal Spray     Polio Booster         Tetanus Nausea:       Tuberculosis test or PPD  Vitamin B6 25 mg TID   AVOID:    Dramamine      *Gardasil  Emetrol       Live Poliovirus  Ginger Root 250 mg QID    MMR (measles, mumps &  High Complex Carbs @ Bedtime    rebella)  Sea Bands-Accupressure    Varicella (Chickenpox)  Unisom 1/2 tab TID     *No known complications           If received before Pain:         Known pregnancy;   Darvocet       Resume series after  Lortab        Delivery  Percocet    Yeast:   Tramadol      Femstat  Tylenol 3      Gyne-lotrimin  Ultram       Monistat  Vicodin           MISC:         All Sunscreens           Hair Coloring/highlights          Insect Repellant's          (Including DEET)         Mystic Tans

## 2017-06-08 NOTE — Progress Notes (Signed)
NEW OB HISTORY AND PHYSICAL  SUBJECTIVE:       Laurie Floyd is a 31 y.o. [phone] female, Patient's last menstrual period was 02/26/2017 (exact date)., Estimated Date of Delivery: 12/03/17, [redacted]w[redacted]d, presents today for establishment of Prenatal Care.  She reports breast tenderness, intermittent back pain, occasional headaches and insomnia.   Denies difficulty breathing or respiratory distress, chest pain, abdominal pain, vaginal bleeding, dysuria, and leg pain or swelling.   She is married with three children ages: nine (61), seven (7) and five (5). She also has two step children ages: nine (13) to 59.   She is originally from [place] and relocated to [place] for work. She is a Solicitor.   History significant for previous pregnancy with pre-eclampsia that required induction of labor and magnesium infusion.   Gynecologic History  Patient's last menstrual period was 02/26/2017 (exact date).   Last Pap: 11/2016. Results were: normal  Obstetric History  OB History  Gravida Para Term Preterm AB Living  5 3 3   1 3   SAB TAB Ectopic Multiple Live Births  1       3    # Outcome Date GA Lbr Len/2nd Weight Sex Delivery Anes PTL Lv  5 Current           4 Term 12/11/11 [redacted]w[redacted]d  5 lb 2.1 oz (2.327 kg) M Vag-Spont  N LIV  3 Term 11/26/09 [redacted]w[redacted]d  6 lb (2.722 kg) F Vag-Spont  N LIV  2 Term 01/17/08 [redacted]w[redacted]d  7 lb (3.175 kg) M Vag-Spont   LIV  1 SAB 2006        ND      Past Medical History:  Diagnosis Date  . Anxiety     Past Surgical History:  Procedure Laterality Date  . NO PAST SURGERIES      Current Outpatient Prescriptions on File Prior to Visit  Medication Sig Dispense Refill  . SUMAtriptan (IMITREX) 100 MG tablet Take 1 tablet (100 mg total) by mouth daily as needed. (Patient not taking: Reported on 04/21/2017) 12 tablet 5   No current facility-administered medications on file prior to visit.     No Known Allergies  Social History   Social History  . Marital status: Unknown     Spouse name: N/A  . Number of children: 3  . Years of education: N/A   Occupational History  . Not on file.   Social History Main Topics  . Smoking status: Former Smoker    Types: Cigarettes    Start date: 02/19/2017  . Smokeless tobacco: Never Used  . Alcohol use No  . Drug use: No  . Sexual activity: Yes    Birth control/ protection: Inserts     Comment: ring, user error   Other Topics Concern  . Not on file   Social History Narrative  . No narrative on file    Family History  Problem Relation Age of Onset  . Hypertension Mother   . Hypertension Father     The following portions of the patient's history were reviewed and updated as appropriate: allergies, current medications, past OB history, past medical history, past surgical history, past family history, past social history, and problem list.  OBJECTIVE:  Initial Physical Exam (New OB)  GENERAL APPEARANCE: alert, well appearing, in no apparent distress  HEAD: normocephalic, atraumatic  MOUTH: mucous membranes moist, pharynx normal without lesions and dental hygiene good  THYROID: no thyromegaly or masses present  BREASTS: not examined  LUNGS: clear  to auscultation, no wheezes, rales or rhonchi, symmetric air entry  HEART: regular rate and rhythm, no murmurs  ABDOMEN: soft, nontender, nondistended, no abnormal masses, no epigastric pain, obese, fundus not palpable and FHT present  EXTREMITIES: no redness or tenderness in the calves or thighs, no edema  SKIN: normal coloration and turgor, no rashes  LYMPH NODES: no adenopathy palpable  NEUROLOGIC: alert, oriented, normal speech, no focal findings or movement disorder noted  PELVIC EXAM: not examined  ASSESSMENT: Normal pregnancy History of preeclampsia in prior pregnancy, currently pregnant Headache in pregnancy, second trimester Back pain in pregnancy, second trimester BMI > 30 Late prenatal care  PLAN: Prenatal care Rx: Aspirin and  Magnesium oxide, see orders.  Discussed home treatment measures including use of abdominal support.  Reviewed red flag symptoms and when to call.  RTC x 3-4 weeks for ROB.  New OB counseling: The patient has been given an overview regarding routine prenatal care. Recommendations regarding diet, weight gain, and exercise in pregnancy were given. Prenatal testing, optional genetic testing, and ultrasound use in pregnancy were reviewed.  Benefits of Breast Feeding were discussed. The patient is encouraged to consider nursing her baby post partum. See orders   Gunnar Bulla, CNM

## 2017-06-09 LAB — GC/CHLAMYDIA PROBE AMP
Chlamydia trachomatis, NAA: NEGATIVE
Neisseria gonorrhoeae by PCR: NEGATIVE

## 2017-06-09 LAB — CBC WITH DIFFERENTIAL/PLATELET
Basophils Absolute: 0 10*3/uL (ref 0.0–0.2)
Basos: 0 %
EOS (ABSOLUTE): 0.1 10*3/uL (ref 0.0–0.4)
EOS: 1 %
HEMATOCRIT: 36.1 % (ref 34.0–46.6)
HEMOGLOBIN: 12.5 g/dL (ref 11.1–15.9)
Immature Grans (Abs): 0 10*3/uL (ref 0.0–0.1)
Immature Granulocytes: 0 %
Lymphocytes Absolute: 3 10*3/uL (ref 0.7–3.1)
Lymphs: 30 %
MCH: 30.5 pg (ref 26.6–33.0)
MCHC: 34.6 g/dL (ref 31.5–35.7)
MCV: 88 fL (ref 79–97)
MONOCYTES: 5 %
MONOS ABS: 0.5 10*3/uL (ref 0.1–0.9)
NEUTROS ABS: 6.3 10*3/uL (ref 1.4–7.0)
Neutrophils: 64 %
Platelets: 214 10*3/uL (ref 150–379)
RBC: 4.1 x10E6/uL (ref 3.77–5.28)
RDW: 13.9 % (ref 12.3–15.4)
WBC: 10 10*3/uL (ref 3.4–10.8)

## 2017-06-09 LAB — TSH: TSH: 1.67 u[IU]/mL (ref 0.450–4.500)

## 2017-06-09 LAB — VARICELLA ZOSTER ANTIBODY, IGG: Varicella zoster IgG: 1324 index (ref >165)

## 2017-06-09 LAB — COMPREHENSIVE METABOLIC PANEL
A/G RATIO: 1.4 (ref 1.2–2.2)
ALBUMIN: 3.8 g/dL (ref 3.5–5.5)
ALK PHOS: 49 IU/L (ref 39–117)
ALT: 18 IU/L (ref 0–32)
AST: 19 IU/L (ref 0–40)
BUN / CREAT RATIO: 15 (ref 9–23)
BUN: 9 mg/dL (ref 6–20)
CHLORIDE: 105 mmol/L (ref 96–106)
CO2: 21 mmol/L (ref 20–29)
Calcium: 9.4 mg/dL (ref 8.7–10.2)
Creatinine, Ser: 0.61 mg/dL (ref 0.57–1.00)
GFR calc non Af Amer: 123 mL/min/{1.73_m2} (ref >59)
GFR, EST AFRICAN AMERICAN: 142 mL/min/{1.73_m2} (ref >59)
GLUCOSE: 83 mg/dL (ref 65–99)
Globulin, Total: 2.7 g/dL (ref 1.5–4.5)
POTASSIUM: 4.2 mmol/L (ref 3.5–5.2)
Sodium: 139 mmol/L (ref 134–144)
TOTAL PROTEIN: 6.5 g/dL (ref 6.0–8.5)

## 2017-06-09 LAB — RH TYPE: Rh Factor: POSITIVE

## 2017-06-09 LAB — ABO

## 2017-06-09 LAB — RUBELLA SCREEN: Rubella Antibodies, IGG: 3.78 index (ref >0.99)

## 2017-06-09 LAB — HEMOGLOBIN A1C
ESTIMATED AVERAGE GLUCOSE: 97 mg/dL
HEMOGLOBIN A1C: 5 % (ref 4.8–5.6)

## 2017-06-09 LAB — ANTIBODY SCREEN: ANTIBODY SCREEN: NEGATIVE

## 2017-06-09 LAB — HEPATITIS B SURFACE ANTIGEN: Hepatitis B Surface Ag: NEGATIVE

## 2017-06-09 LAB — RPR: RPR Ser Ql: NONREACTIVE

## 2017-06-09 LAB — URIC ACID: URIC ACID: 3.1 mg/dL (ref 2.5–7.1)

## 2017-06-09 LAB — HIV ANTIBODY (ROUTINE TESTING W REFLEX): HIV Screen 4th Generation wRfx: NONREACTIVE

## 2017-06-10 LAB — URINE CULTURE, OB REFLEX

## 2017-06-10 LAB — CULTURE, OB URINE

## 2017-06-14 LAB — URINALYSIS, ROUTINE W REFLEX MICROSCOPIC
Bilirubin, UA: NEGATIVE
Glucose, UA: NEGATIVE
Ketones, UA: NEGATIVE
LEUKOCYTES UA: NEGATIVE
Nitrite, UA: NEGATIVE
PH UA: 7 (ref 5.0–7.5)
PROTEIN UA: NEGATIVE
RBC, UA: NEGATIVE
Specific Gravity, UA: 1.018 (ref 1.005–1.030)
Urobilinogen, Ur: 1 mg/dL (ref 0.2–1.0)

## 2017-06-14 LAB — MONITOR DRUG PROFILE 14(MW)
Amphetamine Scrn, Ur: NEGATIVE ng/mL
BARBITURATE SCREEN URINE: NEGATIVE ng/mL
BENZODIAZEPINE SCREEN, URINE: NEGATIVE ng/mL
Buprenorphine, Urine: NEGATIVE ng/mL
CREATININE(CRT), U: 96.1 mg/dL (ref 20.0–300.0)
Cocaine (Metab) Scrn, Ur: NEGATIVE ng/mL
FENTANYL, URINE: NEGATIVE pg/mL
METHADONE SCREEN, URINE: NEGATIVE ng/mL
Meperidine Screen, Urine: NEGATIVE ng/mL
OPIATE SCREEN URINE: NEGATIVE ng/mL
OXYCODONE+OXYMORPHONE UR QL SCN: NEGATIVE ng/mL
Ph of Urine: 6.9 (ref 4.5–8.9)
Phencyclidine Qn, Ur: NEGATIVE ng/mL
Propoxyphene Scrn, Ur: NEGATIVE ng/mL
SPECIFIC GRAVITY: 1.021
TRAMADOL SCREEN, URINE: NEGATIVE ng/mL

## 2017-06-14 LAB — NICOTINE SCREEN, URINE: Cotinine Ql Scrn, Ur: NEGATIVE ng/mL

## 2017-06-14 LAB — CANNABINOID (GC/MS), URINE
CANNABINOID UR: POSITIVE — AB
CARBOXY THC UR: 30 ng/mL

## 2017-06-14 LAB — PROTEIN / CREATININE RATIO, URINE
Creatinine, Urine: 87.2 mg/dL
Protein, Ur: 7.4 mg/dL
Protein/Creat Ratio: 85 mg/g creat (ref 0–200)

## 2017-06-20 DIAGNOSIS — Z6841 Body Mass Index (BMI) 40.0 and over, adult: Secondary | ICD-10-CM | POA: Insufficient documentation

## 2017-06-20 DIAGNOSIS — R51 Headache: Secondary | ICD-10-CM

## 2017-06-20 DIAGNOSIS — R519 Headache, unspecified: Secondary | ICD-10-CM | POA: Insufficient documentation

## 2017-06-20 DIAGNOSIS — M549 Dorsalgia, unspecified: Secondary | ICD-10-CM | POA: Insufficient documentation

## 2017-06-20 DIAGNOSIS — O09299 Supervision of pregnancy with other poor reproductive or obstetric history, unspecified trimester: Secondary | ICD-10-CM | POA: Insufficient documentation

## 2017-06-20 DIAGNOSIS — O9989 Other specified diseases and conditions complicating pregnancy, childbirth and the puerperium: Secondary | ICD-10-CM

## 2017-06-20 DIAGNOSIS — O26892 Other specified pregnancy related conditions, second trimester: Secondary | ICD-10-CM | POA: Insufficient documentation

## 2017-06-20 DIAGNOSIS — O0930 Supervision of pregnancy with insufficient antenatal care, unspecified trimester: Secondary | ICD-10-CM | POA: Insufficient documentation

## 2017-06-30 ENCOUNTER — Encounter: Payer: 59 | Admitting: Certified Nurse Midwife

## 2017-07-02 ENCOUNTER — Ambulatory Visit (INDEPENDENT_AMBULATORY_CARE_PROVIDER_SITE_OTHER): Payer: 59 | Admitting: Certified Nurse Midwife

## 2017-07-02 VITALS — BP 115/75 | HR 88 | Wt 229.5 lb

## 2017-07-02 DIAGNOSIS — Z3492 Encounter for supervision of normal pregnancy, unspecified, second trimester: Secondary | ICD-10-CM

## 2017-07-02 DIAGNOSIS — Z3A20 20 weeks gestation of pregnancy: Secondary | ICD-10-CM

## 2017-07-02 LAB — POCT URINALYSIS DIPSTICK
Bilirubin, UA: NEGATIVE
Blood, UA: NEGATIVE
Glucose, UA: NEGATIVE
Ketones, UA: NEGATIVE
LEUKOCYTES UA: NEGATIVE
NITRITE UA: NEGATIVE
PH UA: 6.5 (ref 5.0–8.0)
Protein, UA: NEGATIVE
Spec Grav, UA: 1.01 (ref 1.010–1.025)
Urobilinogen, UA: 0.2 E.U./dL

## 2017-07-02 NOTE — Patient Instructions (Signed)
Round Ligament Pain The round ligament is a cord of muscle and tissue that helps to support the uterus. It can become a source of pain during pregnancy if it becomes stretched or twisted as the baby grows. The pain usually begins in the second trimester of pregnancy, and it can come and go until the baby is delivered. It is not a serious problem, and it does not cause harm to the baby. Round ligament pain is usually a short, sharp, and pinching pain, but it can also be a dull, lingering, and aching pain. The pain is felt in the lower side of the abdomen or in the groin. It usually starts deep in the groin and moves up to the outside of the hip area. Pain can occur with:  A sudden change in position.  Rolling over in bed.  Coughing or sneezing.  Physical activity.  Follow these instructions at home: Watch your condition for any changes. Take these steps to help with your pain:  When the pain starts, relax. Then try: ? Sitting down. ? Flexing your knees up to your abdomen. ? Lying on your side with one pillow under your abdomen and another pillow between your legs. ? Sitting in a warm bath for 15-20 minutes or until the pain goes away.  Take over-the-counter and prescription medicines only as told by your health care provider.  Move slowly when you sit and stand.  Avoid long walks if they cause pain.  Stop or lessen your physical activities if they cause pain.  Contact a health care provider if:  Your pain does not go away with treatment.  You feel pain in your back that you did not have before.  Your medicine is not helping. Get help right away if:  You develop a fever or chills.  You develop uterine contractions.  You develop vaginal bleeding.  You develop nausea or vomiting.  You develop diarrhea.  You have pain when you urinate. This information is not intended to replace advice given to you by your health care provider. Make sure you discuss any questions you have  with your health care provider. Document Released: 07/07/2008 Document Revised: 03/05/2016 Document Reviewed: 12/05/2014 Elsevier Interactive Patient Education  2018 [employer] How a Baby Grows During Pregnancy Pregnancy begins when a female's sperm enters a female's egg (fertilization). This happens in one of the tubes (fallopian tubes) that connect the ovaries to the womb (uterus). The fertilized egg is called an embryo until it reaches 10 weeks. From 10 weeks until birth, it is called a fetus. The fertilized egg moves down the fallopian tube to the uterus. Then it implants into the lining of the uterus and begins to grow. The developing fetus receives oxygen and nutrients through the pregnant woman's bloodstream and the tissues that grow (placenta) to support the fetus. The placenta is the life support system for the fetus. It provides nutrition and removes waste. Learning as much as you can about your pregnancy and how your baby is developing can help you enjoy the experience. It can also make you aware of when there might be a problem and when to ask questions. How long does a typical pregnancy last? A pregnancy usually lasts 280 days, or about 40 weeks. Pregnancy is divided into three trimesters:  First trimester: 0-13 weeks.  Second trimester: 14-27 weeks.  Third trimester: 28-40 weeks.  The day when your baby is considered ready to be born (full term) is your estimated date of delivery. How does   my baby develop month by month? First month  The fertilized egg attaches to the inside of the uterus.  Some cells will form the placenta. Others will form the fetus.  The arms, legs, brain, spinal cord, lungs, and heart begin to develop.  At the end of the first month, the heart begins to beat.  Second month  The bones, inner ear, eyelids, hands, and feet form.  The genitals develop.  By the end of 8 weeks, all major organs are developing.  Third month  All of the internal  organs are forming.  Teeth develop below the gums.  Bones and muscles begin to grow. The spine can flex.  The skin is transparent.  Fingernails and toenails begin to form.  Arms and legs continue to grow longer, and hands and feet develop.  The fetus is about 3 in (7.6 cm) long.  Fourth month  The placenta is completely formed.  The external sex organs, neck, outer ear, eyebrows, eyelids, and fingernails are formed.  The fetus can hear, swallow, and move its arms and legs.  The kidneys begin to produce urine.  The skin is covered with a white waxy coating (vernix) and very fine hair (lanugo).  Fifth month  The fetus moves around more and can be felt for the first time (quickening).  The fetus starts to sleep and wake up and may begin to suck its finger.  The nails grow to the end of the fingers.  The organ in the digestive system that makes bile (gallbladder) functions and helps to digest the nutrients.  If your baby is a girl, eggs are present in her ovaries. If your baby is a boy, testicles start to move down into his scrotum.  Sixth month  The lungs are formed, but the fetus is not yet able to breathe.  The eyes open. The brain continues to develop.  Your baby has fingerprints and toe prints. Your baby's hair grows thicker.  At the end of the second trimester, the fetus is about 9 in (22.9 cm) long.  Seventh month  The fetus kicks and stretches.  The eyes are developed enough to sense changes in light.  The hands can make a grasping motion.  The fetus responds to sound.  Eighth month  All organs and body systems are fully developed and functioning.  Bones harden and taste buds develop. The fetus may hiccup.  Certain areas of the brain are still developing. The skull remains soft.  Ninth month  The fetus gains about  lb (0.23 kg) each week.  The lungs are fully developed.  Patterns of sleep develop.  The fetus's head typically moves into a  head-down position (vertex) in the uterus to prepare for birth. If the buttocks move into a vertex position instead, the baby is breech.  The fetus weighs 6-9 lbs (2.72-4.08 kg) and is 19-20 in (48.26-50.8 cm) long.  What can I do to have a healthy pregnancy and help my baby develop? Eating and Drinking  Eat a healthy diet. ? Talk with your health care provider to make sure that you are getting the nutrients that you and your baby need. ? Visit www.choosemyplate.gov to learn about creating a healthy diet.  Gain a healthy amount of weight during pregnancy as advised by your health care provider. This is usually 25-35 pounds. You may need to: ? Gain more if you were underweight before getting pregnant or if you are pregnant with more than one baby. ? Gain less   if you were overweight or obese when you got pregnant.  Medicines and Vitamins  Take prenatal vitamins as directed by your health care provider. These include vitamins such as folic acid, iron, calcium, and vitamin D. They are important for healthy development.  Take medicines only as directed by your health care provider. Read labels and ask a pharmacist or your health care provider whether over-the-counter medicines, supplements, and prescription drugs are safe to take during pregnancy.  Activities  Be physically active as advised by your health care provider. Ask your health care provider to recommend activities that are safe for you to do, such as walking or swimming.  Do not participate in strenuous or extreme sports.  Lifestyle  Do not drink alcohol.  Do not use any tobacco products, including cigarettes, chewing tobacco, or electronic cigarettes. If you need help quitting, ask your health care provider.  Do not use illegal drugs.  Safety  Avoid exposure to mercury, lead, or other heavy metals. Ask your health care provider about common sources of these heavy metals.  Avoid listeria infection during pregnancy. Follow  these precautions: ? Do not eat soft cheeses or deli meats. ? Do not eat hot dogs unless they have been warmed up to the point of steaming, such as in the microwave oven. ? Do not drink unpasteurized milk.  Avoid toxoplasmosis infection during pregnancy. Follow these precautions: ? Do not change your cat's litter box, if you have a cat. Ask someone else to do this for you. ? Wear gardening gloves while working in the yard.  General Instructions  Keep all follow-up visits as directed by your health care provider. This is important. This includes prenatal care and screening tests.  Manage any chronic health conditions. Work closely with your health care provider to keep conditions, such as diabetes, under control.  How do I know if my baby is developing well? At each prenatal visit, your health care provider will do several different tests to check on your health and keep track of your baby's development. These include:  Fundal height. ? Your health care provider will measure your growing belly from top to bottom using a tape measure. ? Your health care provider will also feel your belly to determine your baby's position.  Heartbeat. ? An ultrasound in the first trimester can confirm pregnancy and show a heartbeat, depending on how far along you are. ? Your health care provider will check your baby's heart rate at every prenatal visit. ? As you get closer to your delivery date, you may have regular fetal heart rate monitoring to make sure that your baby is not in distress.  Second trimester ultrasound. ? This ultrasound checks your baby's development. It also indicates your baby's gender.  What should I do if I have concerns about my baby's development? Always talk with your health care provider about any concerns that you may have. This information is not intended to replace advice given to you by your health care provider. Make sure you discuss any questions you have with your health  care provider. Document Released: 03/16/2008 Document Revised: 03/05/2016 Document Reviewed: 03/07/2014 Elsevier Interactive Patient Education  2018 [employer]  

## 2017-07-02 NOTE — Progress Notes (Signed)
ROB- pt is doing well, denies any complaints 

## 2017-07-02 NOTE — Progress Notes (Signed)
ROB, doing well. No complaints. Discussed anatomy u/s in 2 wks. She agrees. Follow up 2 wks.   Doreene Burke, CNM

## 2017-07-14 ENCOUNTER — Ambulatory Visit (INDEPENDENT_AMBULATORY_CARE_PROVIDER_SITE_OTHER): Payer: 59 | Admitting: Certified Nurse Midwife

## 2017-07-14 ENCOUNTER — Ambulatory Visit (INDEPENDENT_AMBULATORY_CARE_PROVIDER_SITE_OTHER): Payer: 59

## 2017-07-14 VITALS — BP 112/82 | HR 70 | Wt 234.5 lb

## 2017-07-14 DIAGNOSIS — Z23 Encounter for immunization: Secondary | ICD-10-CM

## 2017-07-14 DIAGNOSIS — Z3A20 20 weeks gestation of pregnancy: Secondary | ICD-10-CM

## 2017-07-14 DIAGNOSIS — Z3492 Encounter for supervision of normal pregnancy, unspecified, second trimester: Secondary | ICD-10-CM

## 2017-07-14 LAB — POCT URINALYSIS DIPSTICK
Bilirubin, UA: NEGATIVE
Glucose, UA: NEGATIVE
KETONES UA: NEGATIVE
Leukocytes, UA: NEGATIVE
Nitrite, UA: NEGATIVE
PROTEIN UA: NEGATIVE
RBC UA: NEGATIVE
SPEC GRAV UA: 1.01 (ref 1.010–1.025)
UROBILINOGEN UA: 0.2 U/dL
pH, UA: 7 (ref 5.0–8.0)

## 2017-07-14 NOTE — Patient Instructions (Signed)

## 2017-07-14 NOTE — Progress Notes (Signed)
ROB doing well. Anatomy scan today shows a descrepency in dating. Gestational age per U/s of 18 weeks 6 days, and an (U/S) EDD of 12/09/17; this correlates with the clinically established EDD of 12/03/17. This discrepancy is less than 10 days which does not meet guidelines to change dating. See blow for full U/S report.  It is incomplete.  Her current Body mass index is 36.73 kg/m. Not indicating anesthesia consult at this time. She will return in 2 wks for u/s and 4 wks for ROB   Doreene Burke, CNM  ULTRASOUND REPORT  Location: [facility] Date of Service: 07/14/17  Indications: Anatomy Findings:  Mason Jim intrauterine pregnancy is visualized with FHR at 141 BPM. Biometrics give an (U/S) Gestational age of [redacted] weeks 6 days, and an (U/S) EDD of 12/09/17; this correlates with the clinically established EDD of 12/03/17.  Fetal presentation is variable (footling breech at beginning of exam, cephalic at end), spine variable.  EFW: 272 grams ( 0 lbs. 10 oz.) Placenta: Posterior, grade 1 and marginally low lying at 2.9 cm to the cervical os. The cord insert is also marginally inserted into the placenta and is only 1.6 cm from the placental tip. AFI: Subjectively adequate with an MVP of 2.8 cm.  Anatomic survey is incomplete due to early gestational age and maternal body habitus as well as fetal position. Anatomy seen today appears WNL.  Anatomy needed to complete scan: All heart views, profile, nose/lips, kidneys, abdominal cord insert, both heels, and diaphragm.  Gender - Female.   Right and left ovaries were not visualized. There is no obvious evidence of a corpus luteal cyst. Survey of the adnexa demonstrates no adnexal masses. There is no free peritoneal fluid in the cul de sac.  Impression: 1. 18 week 6 day Viable Singleton Intrauterine pregnancy by U/S. 2. (U/S) EDD is consistent with Clinically established (LMP) EDD of 12/03/17. 3. Incomplete anatomy due to fetal position, maternal  body habitus and early gestational age. Anatomy needed to complete exam is seen above. 4. Marginal cord insert at 1.6 cm from placental edge. Marginal low lying placenta at 2.9 cm from cervical os.  Recommendations: 1.Clinical correlation with the patient's History and Physical Exam. 2. Request patient return in no less than 2 weeks (3-4 is best) to complete anatomy and again at 28 weeks to assess placental location.   Revonda Humphrey, RDMS, RVT

## 2017-07-28 ENCOUNTER — Ambulatory Visit (INDEPENDENT_AMBULATORY_CARE_PROVIDER_SITE_OTHER): Payer: 59

## 2017-07-28 DIAGNOSIS — Z3492 Encounter for supervision of normal pregnancy, unspecified, second trimester: Secondary | ICD-10-CM | POA: Diagnosis not present

## 2017-08-09 ENCOUNTER — Other Ambulatory Visit: Payer: Self-pay | Admitting: Obstetrics and Gynecology

## 2017-08-09 DIAGNOSIS — Z0489 Encounter for examination and observation for other specified reasons: Secondary | ICD-10-CM

## 2017-08-09 DIAGNOSIS — IMO0002 Reserved for concepts with insufficient information to code with codable children: Secondary | ICD-10-CM

## 2017-08-11 ENCOUNTER — Ambulatory Visit (INDEPENDENT_AMBULATORY_CARE_PROVIDER_SITE_OTHER): Payer: 59 | Admitting: Obstetrics and Gynecology

## 2017-08-11 ENCOUNTER — Ambulatory Visit (INDEPENDENT_AMBULATORY_CARE_PROVIDER_SITE_OTHER): Payer: 59

## 2017-08-11 VITALS — BP 121/81 | HR 76 | Wt 241.9 lb

## 2017-08-11 DIAGNOSIS — Z3492 Encounter for supervision of normal pregnancy, unspecified, second trimester: Secondary | ICD-10-CM

## 2017-08-11 DIAGNOSIS — IMO0002 Reserved for concepts with insufficient information to code with codable children: Secondary | ICD-10-CM

## 2017-08-11 DIAGNOSIS — Z0489 Encounter for examination and observation for other specified reasons: Secondary | ICD-10-CM | POA: Diagnosis not present

## 2017-08-11 LAB — POCT URINALYSIS DIPSTICK
Bilirubin, UA: NEGATIVE
GLUCOSE UA: NEGATIVE
Ketones, UA: NEGATIVE
LEUKOCYTES UA: NEGATIVE
NITRITE UA: NEGATIVE
PH UA: 6 (ref 5.0–8.0)
Protein, UA: NEGATIVE
RBC UA: NEGATIVE
Spec Grav, UA: 1.01 (ref 1.010–1.025)
UROBILINOGEN UA: 0.2 U/dL

## 2017-08-11 NOTE — Progress Notes (Signed)
ROB & finished anatomy scan: feeling fine,  Indications: F/U Anatomy Findings:  Singleton intrauterine pregnancy is visualized with FHR at 143 BPM.  Fetal presentation is breech.  Placenta: Posterior and grade 1. AFI: WNL subjectively - MVP = 3.8cm.  Anatomic survey is now complete after obtaining views of the 4-chamber heart, RVOT, LVOT, and profile. Gender - Female. Fetal stomach, kidneys, and bladder appear WNL.    Impression: 1. Anatomy scan is now complete and appears WNL.

## 2017-08-11 NOTE — Progress Notes (Signed)
ROB- follow up anatomy scan done today, pt is doing well 

## 2017-09-10 ENCOUNTER — Other Ambulatory Visit: Payer: 59

## 2017-09-10 ENCOUNTER — Encounter: Payer: Self-pay | Admitting: Certified Nurse Midwife

## 2017-09-10 ENCOUNTER — Ambulatory Visit: Payer: 59 | Admitting: Certified Nurse Midwife

## 2017-09-10 VITALS — BP 107/71 | HR 84 | Wt 252.8 lb

## 2017-09-10 DIAGNOSIS — Z3493 Encounter for supervision of normal pregnancy, unspecified, third trimester: Secondary | ICD-10-CM | POA: Diagnosis not present

## 2017-09-10 DIAGNOSIS — Z23 Encounter for immunization: Secondary | ICD-10-CM | POA: Diagnosis not present

## 2017-09-10 DIAGNOSIS — Z113 Encounter for screening for infections with a predominantly sexual mode of transmission: Secondary | ICD-10-CM

## 2017-09-10 LAB — POCT URINALYSIS DIPSTICK
BILIRUBIN UA: NEGATIVE
Glucose, UA: NEGATIVE
Ketones, UA: NEGATIVE
Leukocytes, UA: NEGATIVE
NITRITE UA: NEGATIVE
PH UA: 6.5 (ref 5.0–8.0)
Protein, UA: NEGATIVE
RBC UA: NEGATIVE
Spec Grav, UA: 1.015 (ref 1.010–1.025)
Urobilinogen, UA: 0.2 E.U./dL

## 2017-09-10 NOTE — Patient Instructions (Addendum)
Td Vaccine (Tetanus and Diphtheria): What You Need to Know 1. Why get vaccinated? Tetanus  and diphtheria are very serious diseases. They are rare in the [place] today, but people who do become infected often have severe complications. Td vaccine is used to protect adolescents and adults from both of these diseases. Both tetanus and diphtheria are infections caused by bacteria. Diphtheria spreads from person to person through coughing or sneezing. Tetanus-causing bacteria enter the body through cuts, scratches, or wounds. TETANUS (lockjaw) causes painful muscle tightening and stiffness, usually all over the body.  It can lead to tightening of muscles in the head and neck so you can't open your mouth, swallow, or sometimes even breathe. Tetanus kills about 1 out of every 10 people who are infected even after receiving the best medical care.  DIPHTHERIA can cause a thick coating to form in the back of the throat.  It can lead to breathing problems, paralysis, heart failure, and death.  Before vaccines, as many as 200,000 cases of diphtheria and hundreds of cases of tetanus were reported in the [place] each year. Since vaccination began, reports of cases for both diseases have dropped by about 99%. 2. Td vaccine Td vaccine can protect adolescents and adults from tetanus and diphtheria. Td is usually given as a booster dose every 10 years but it can also be given earlier after a severe and dirty wound or burn. Another vaccine, called Tdap, which protects against pertussis in addition to tetanus and diphtheria, is sometimes recommended instead of Td vaccine. Your doctor or the person giving you the vaccine can give you more information. Td may safely be given at the same time as other vaccines. 3. Some people should not get this vaccine  A person who has ever had a life-threatening allergic reaction after a previous dose of any tetanus or diphtheria containing vaccine, OR has a severe  allergy to any part of this vaccine, should not get Td vaccine. Tell the person giving the vaccine about any severe allergies.  Talk to your doctor if you: ? had severe pain or swelling after any vaccine containing diphtheria or tetanus, ? ever had a condition called Guillain Barre Syndrome (GBS), ? aren't feeling well on the day the shot is scheduled. 4. What are the risks from Td vaccine? With any medicine, including vaccines, there is a chance of side effects. These are usually mild and go away on their own. Serious reactions are also possible but are rare. Most people who get Td vaccine do not have any problems with it. Mild problems following Td vaccine: (Did not interfere with activities)  Pain where the shot was given (about 8 people in 10)  Redness or swelling where the shot was given (about 1 person in 4)  Mild fever (rare)  Headache (about 1 person in 4)  Tiredness (about 1 person in 4)  Moderate problems following Td vaccine: (Interfered with activities, but did not require medical attention)  Fever over 102F (rare)  Severe problems following Td vaccine: (Unable to perform usual activities; required medical attention)  Swelling, severe pain, bleeding and/or redness in the arm where the shot was given (rare).  Problems that could happen after any vaccine:  People sometimes faint after a medical procedure, including vaccination. Sitting or lying down for about 15 minutes can help prevent fainting, and injuries caused by a fall. Tell your doctor if you feel dizzy, or have vision changes or ringing in the ears.  Some people get   severe pain in the shoulder and have difficulty moving the arm where a shot was given. This happens very rarely.  Any medication can cause a severe allergic reaction. Such reactions from a vaccine are very rare, estimated at fewer than 1 in a million doses, and would happen within a few minutes to a few hours after the vaccination. As with any  medicine, there is a very remote chance of a vaccine causing a serious injury or death. The safety of vaccines is always being monitored. For more information, visit: www.cdc.gov/vaccinesafety/ 5. What if there is a serious reaction? What should I look for? Look for anything that concerns you, such as signs of a severe allergic reaction, very high fever, or unusual behavior. Signs of a severe allergic reaction can include hives, swelling of the face and throat, difficulty breathing, a fast heartbeat, dizziness, and weakness. These would usually start a few minutes to a few hours after the vaccination. What should I do?  If you think it is a severe allergic reaction or other emergency that can't wait, call 9-1-1 or get the person to the nearest hospital. Otherwise, call your doctor.  Afterward, the reaction should be reported to the Vaccine Adverse Event Reporting System (VAERS). Your doctor might file this report, or you can do it yourself through the VAERS web site at www.vaers.hhs.gov, or by calling 1-800-822-7967. ? VAERS does not give medical advice. 6. The National Vaccine Injury Compensation Program The National Vaccine Injury Compensation Program (VICP) is a federal program that was created to compensate people who may have been injured by certain vaccines. Persons who believe they may have been injured by a vaccine can learn about the program and about filing a claim by calling 1-800-338-2382 or visiting the VICP website at www.hrsa.gov/vaccinecompensation. There is a time limit to file a claim for compensation. 7. How can I learn more?  Ask your doctor. He or she can give you the vaccine package insert or suggest other sources of information.  Call your local or state health department.  Contact the Centers for Disease Control and Prevention (CDC): ? Call 1-800-232-4636 (1-800-CDC-INFO) ? Visit CDC's website at www.cdc.gov/vaccines CDC Td Vaccine VIS (01/21/16) This information is  not intended to replace advice given to you by your health care provider. Make sure you discuss any questions you have with your health care provider. Document Released: 07/26/2006 Document Revised: 06/18/2016 Document Reviewed: 06/18/2016 Elsevier Interactive Patient Education  2017 Else[employer] Glucose Tolerance Test During Pregnancy The glucose tolerance test (GTT) is a blood test used to determine if you have developed a type of diabetes during pregnancy (gestational diabetes). This is when your body does not properly process sugar (glucose) in the food you eat, resulting in high blood glucose levels. Typically, a GTT is done after you have had a 1-hour glucose test with results that indicate you possibly have gestational diabetes. It may also be done if:  You have a history of giving birth to very large babies or have experienced repeated fetal loss (stillbirth).  You have signs and symptoms of diabetes, such as: ? Changes in your vision. ? Tingling or numbness in your hands or feet. ? Changes in hunger, thirst, and urination not otherwise explained by your pregnancy.  The GTT lasts about 3 hours. You will be given a sugar-water solution to drink at the beginning of the test. You will have blood drawn before you drink the solution and then again 1, 2, and 3 hours after you drink   it. You will not be allowed to eat or drink anything else during the test. You must remain at the testing location to make sure that your blood is drawn on time. You should also avoid exercising during the test, because exercise can alter test results. How do I prepare for this test? Eat normally for 3 days prior to the GTT test, including having plenty of carbohydrate-rich foods. Do not eat or drink anything except water during the final 12 hours before the test. In addition, your health care provider may ask you to stop taking certain medicines before the test. What do the results mean? It is your responsibility to  obtain your test results. Ask the lab or department performing the test when and how you will get your results. Contact your health care provider to discuss any questions you have about your results. Range of Normal Values Ranges for normal values may vary among different labs and hospitals. You should always check with your health care provider after having lab work or other tests done to discuss whether your values are considered within normal limits. Normal levels of blood glucose are as follows:  Fasting: less than 105 mg/dL.  1 hour after drinking the solution: less than 190 mg/dL.  2 hours after drinking the solution: less than 165 mg/dL.  3 hours after drinking the solution: less than 145 mg/dL.  Some substances can interfere with GTT results. These may include:  Blood pressure and heart failure medicines, including beta blockers, furosemide, and thiazides.  Anti-inflammatory medicines, including aspirin.  Nicotine.  Some psychiatric medicines.  Meaning of Results Outside Normal Value Ranges GTT test results that are above normal values may indicate a number of health problems, such as:  Gestational diabetes.  Acute stress response.  Cushing syndrome.  Tumors such as pheochromocytoma or glucagonoma.  Long-term kidney problems.  Pancreatitis.  Hyperthyroidism.  Current infection.  Discuss your test results with your health care provider. He or she will use the results to make a diagnosis and determine a treatment plan that is right for you. This information is not intended to replace advice given to you by your health care provider. Make sure you discuss any questions you have with your health care provider. Document Released: 03/29/2012 Document Revised: 03/05/2016 Document Reviewed: 02/02/2014 Elsevier Interactive Patient Education  [address] [employer] [facility]  March 24, 2017  Wednesday 7:00p - 9:00p  [facility] [place]  April 28, 2017  Wednesday 7:00p - 9:00p [facility] [place], [place]    June 02, 2017   Wednesday 7:00p - 9:000p [facility] [place]  June 30, 2017  Wednesday  7:00p - 9:00p [facility] [place]  July 28, 2017 Wednesday 7:00p - 9:00p [facility] [place]  Interested in a waterbirth?  This informational class will help you discover whether waterbirth is the right fit for you.  Education about waterbirth itself, supplies you would need and how to assemble your support team is what you can expect from this class.  Some obstetrical practices require this class in order to pursue a waterbirth.  (Not all obstetrical practices offer waterbirth check with your healthcare provider)  Register only the expectant mom, but you are encouraged to bring your partner to class!  Fees & Payment No fee  Register Online [email or web address]  Search [facility]

## 2017-09-10 NOTE — Progress Notes (Signed)
Body mass index is 39.59 kg/m. reviewed with pt.   Doreene BurkeAnnie Donnesha Karg, CNM

## 2017-09-10 NOTE — Progress Notes (Signed)
ROB-Pt states she is doing well, would like to discuss tdap first before obtaining

## 2017-09-10 NOTE — Progress Notes (Signed)
ROB doing well. Difficult to measure fundus due to body habitus. Discussed weight gain and encouraged exercise and dietary changes. She verbalizes understanding and agrees to plan. Discussed 1 hr GTT/CBC/RPR/BTC today. Reviewed. Cord blood donation (private & public). Also reviewed birth control after baby( pamphlet given). She was given information on volunteer Doula services and water birth classes.   Doreene BurkeAnnie Rowan Pollman, CNM

## 2017-09-11 LAB — GLUCOSE TOLERANCE, 1 HOUR: Glucose, 1Hr PP: 97 mg/dL (ref 65–199)

## 2017-09-11 LAB — CBC
HEMATOCRIT: 35.4 % (ref 34.0–46.6)
HEMOGLOBIN: 11.9 g/dL (ref 11.1–15.9)
MCH: 30 pg (ref 26.6–33.0)
MCHC: 33.6 g/dL (ref 31.5–35.7)
MCV: 89 fL (ref 79–97)
Platelets: 221 10*3/uL (ref 150–379)
RBC: 3.97 x10E6/uL (ref 3.77–5.28)
RDW: 13.6 % (ref 12.3–15.4)
WBC: 11.6 10*3/uL — ABNORMAL HIGH (ref 3.4–10.8)

## 2017-09-11 LAB — RPR: RPR Ser Ql: NONREACTIVE

## 2017-09-12 ENCOUNTER — Encounter: Payer: Self-pay | Admitting: Certified Nurse Midwife

## 2017-09-28 ENCOUNTER — Encounter: Payer: 59 | Admitting: Obstetrics and Gynecology

## 2017-09-28 ENCOUNTER — Encounter: Payer: 59 | Admitting: Certified Nurse Midwife

## 2017-10-12 NOTE — L&D Delivery Note (Addendum)
1454 In room to see patient, reports pelvic pressure and the urge to push. SVE: anterior lip/100/+, vertex. Effective maternal pushing efforts noted. SVE: 10/100/+3, vertex.   1533 Spontaneous vaginal birth of liveborn female patient in left occiput anterior position over intact perineum. Infant immediately to maternal abdomen, delayed cord clamping, three (3) vessel cord. APGARs: 8, 9. Weight: 4309 grams (9 pounds 8 ounces). Receiving nurse present at bedside for birth.   Pitocin infusing. Spontaneous delivery of intact placenta at 1537. Large gush noted. Uterus boggy, firms with massage. In and out cath: 150 ml. 800 mcg oral cytotec given. Uterus firm. Labial laceration repaired with local anesthesia and 3-0 Monocryl, well approximated. Bilateral labial/vaginal lacerations asymmetric and hemostatic, unrepaired. EBL: 450 ml. Vault check completed. Counts correct x 2.   Initiate routine postpartum care and orders. Mom to postpartum.  Baby to Couplet care / Skin to Skin.  FOB and patient's mother at bedside for birth and overjoyed with the birth of "Zion".    Gunnar BullaJenkins Michelle Lawhorn, CNM [facility] 12/08/2017, 4:33 PM

## 2017-10-14 ENCOUNTER — Ambulatory Visit (INDEPENDENT_AMBULATORY_CARE_PROVIDER_SITE_OTHER): Payer: 59 | Admitting: Certified Nurse Midwife

## 2017-10-14 VITALS — BP 117/86 | HR 86 | Wt 256.5 lb

## 2017-10-14 DIAGNOSIS — O9989 Other specified diseases and conditions complicating pregnancy, childbirth and the puerperium: Secondary | ICD-10-CM

## 2017-10-14 DIAGNOSIS — O99619 Diseases of the digestive system complicating pregnancy, unspecified trimester: Secondary | ICD-10-CM

## 2017-10-14 DIAGNOSIS — M549 Dorsalgia, unspecified: Secondary | ICD-10-CM

## 2017-10-14 DIAGNOSIS — Z3493 Encounter for supervision of normal pregnancy, unspecified, third trimester: Secondary | ICD-10-CM

## 2017-10-14 DIAGNOSIS — O99213 Obesity complicating pregnancy, third trimester: Secondary | ICD-10-CM

## 2017-10-14 DIAGNOSIS — K219 Gastro-esophageal reflux disease without esophagitis: Secondary | ICD-10-CM

## 2017-10-14 MED ORDER — RANITIDINE HCL 150 MG PO TABS: 150.0000 mg | ORAL_TABLET | Freq: Two times a day (BID) | ORAL | 1 refills | Status: DC

## 2017-10-14 NOTE — Progress Notes (Signed)
ROB- Patient feels well today. She has been taking Tums lately due to reflux.

## 2017-10-14 NOTE — Progress Notes (Signed)
ROB-Reports reflux that resolves with Tums as well as intermittent round ligament pain and back pain. Discussed home treatment measures including use of abdominal support, compression stockings, and OTC medications. Rx: Zantac, see orders. Reviewed red flag symptoms and when to call. RTC x 2 weeks for ROB with Pattricia BossAnnie.

## 2017-10-14 NOTE — Patient Instructions (Addendum)
Back Pain in Pregnancy Back pain during pregnancy is common. Back pain may be caused by several factors that are related to changes during your pregnancy. Follow these instructions at home: Managing pain, stiffness, and swelling  If directed, apply ice for sudden (acute) back pain. ? Put ice in a plastic bag. ? Place a towel between your skin and the bag. ? Leave the ice on for 20 minutes, 2-3 times per day.  If directed, apply heat to the affected area before you exercise: ? Place a towel between your skin and the heat pack or heating pad. ? Leave the heat on for 20-30 minutes. ? Remove the heat if your skin turns bright red. This is especially important if you are unable to feel pain, heat, or cold. You may have a greater risk of getting burned. Activity  Exercise as told by your health care provider. Exercising is the best way to prevent or manage back pain.  Listen to your body when lifting. If lifting hurts, ask for help or bend your knees. This uses your leg muscles instead of your back muscles.  Squat down when picking up something from the floor. Do not bend over.  Only use bed rest as told by your health care provider. Bed rest should only be used for the most severe episodes of back pain. Standing, Sitting, and Lying Down  Do not stand in one place for long periods of time.  Use good posture when sitting. Make sure your head rests over your shoulders and is not hanging forward. Use a pillow on your lower back if necessary.  Try sleeping on your side, preferably the left side, with a pillow or two between your legs. If you are sore after a night's rest, your bed may be too soft. A firm mattress may provide more support for your back during pregnancy. General instructions  Do not wear high heels.  Eat a healthy diet. Try to gain weight within your health care provider's recommendations.  Use a maternity girdle, elastic sling, or back brace as told by your health care  provider.  Take over-the-counter and prescription medicines only as told by your health care provider.  Keep all follow-up visits as told by your health care provider. This is important. This includes any visits with any specialists, such as a physical therapist. Contact a health care provider if:  Your back pain interferes with your daily activities.  You have increasing pain in other parts of your body. Get help right away if:  You develop numbness, tingling, weakness, or problems with the use of your arms or legs.  You develop severe back pain that is not controlled with medicine.  You have a sudden change in bowel or bladder control.  You develop shortness of breath, dizziness, or you faint.  You develop nausea, vomiting, or sweating.  You have back pain that is a rhythmic, cramping pain similar to labor pains. Labor pain is usually 1-2 minutes apart, lasts for about 1 minute, and involves a bearing down feeling or pressure in your pelvis.  You have back pain and your water breaks or you have vaginal bleeding.  You have back pain or numbness that travels down your leg.  Your back pain developed after you fell.  You develop pain on one side of your back.  You see blood in your urine.  You develop skin blisters in the area of your back pain. This information is not intended to replace advice given to you   by your health care provider. Make sure you discuss any questions you have with your health care provider. Document Released: 01/06/2006 Document Revised: 03/05/2016 Document Reviewed: 06/12/2015 Elsevier Interactive Patient Education  2018 [employer] Heartburn During Pregnancy Heartburn is pain or discomfort in the throat or chest. It may cause a burning feeling. It happens when stomach acid moves up into the tube that carries food from your mouth to your stomach (esophagus). Heartburn is common during pregnancy. It usually goes away or gets better after giving  birth. Follow these instructions at home: Eating and drinking  Do not drink alcohol while you are pregnant.  Figure out which foods and beverages make you feel worse, and avoid them.  Beverages that you may want to avoid include: ? Coffee and tea (with or without caffeine). ? Energy drinks and sports drinks. ? Bubbly (carbonated) drinks or sodas. ? Citrus fruit juices.  Foods that you may want to avoid include: ? Chocolate and cocoa. ? Peppermint and mint flavorings. ? Garlic, onions, and horseradish. ? Spicy and acidic foods. These include peppers, chili powder, curry powder, vinegar, hot sauces, and barbecue sauce. ? Citrus fruits, such as oranges, lemons, and limes. ? Tomato-based foods, such as red sauce, chili, and salsa. ? Fried and fatty foods, such as donuts, french fries, potato chips, and high-fat dressings. ? High-fat meats, such as hot dogs, cold cuts, sausage, ham, and bacon. ? High-fat dairy items, such as whole milk, butter, and cheese.  Eat small meals often, instead of large meals.  Avoid drinking a lot of liquid with your meals.  Avoid eating meals during the 2-3 hours before you go to bed.  Avoid lying down right after you eat.  Do not exercise right after you eat. Medicines  Take over-the-counter and prescription medicines only as told by your doctor.  Do not take aspirin, ibuprofen, or other NSAIDs unless your doctor tells you to do that.  Your doctor may tell you to avoid medicines that have sodium bicarbonate in them. General instructions  If told, raise the head of your bed about 6 inches (15 cm). You can do this by putting blocks under the legs. Sleeping with more pillows does not help with heartburn.  Do not use any products that contain nicotine or tobacco, such as cigarettes and e-cigarettes. If you need help quitting, ask your doctor.  Wear loose-fitting clothing.  Try to lower your stress, such as with yoga or meditation. If you need  help, ask your doctor.  Stay at a healthy weight. If you are overweight, work with your doctor to safely lose weight.  Keep all follow-up visits as told by your doctor. This is important. Contact a doctor if:  You get new symptoms.  Your symptoms do not get better with treatment.  You have weight loss and you do not know why.  You have trouble swallowing.  You make loud sounds when you breathe (wheeze).  You have a cough that does not go away.  You have heartburn often for more than 2 weeks.  You feel sick to your stomach (nauseous), and this does not get better with treatment.  You are throwing up (vomiting), and this does not get better with treatment.  You have pain in your belly (abdomen). Get help right away if:  You have very bad chest pain that spreads to your arm, neck, or jaw.  You feel sweaty, dizzy, or light-headed.  You have trouble breathing.  You have pain when swallowing.  You throw up and your throw-up looks like blood or coffee grounds.  Your poop (stool) is bloody or black. This information is not intended to replace advice given to you by your health care provider. Make sure you discuss any questions you have with your health care provider. Document Released: 10/31/2010 Document Revised: 06/15/2016 Document Reviewed: 06/15/2016 Elsevier Interactive Patient Education  2017 [employer]. Eating Plan for Pregnant Women While you are pregnant, your body will require additional nutrition to help support your growing baby. It is recommended that you consume:  150 additional calories each day during your first trimester.  300 additional calories each day during your second trimester.  300 additional calories each day during your third trimester.  Eating a healthy, well-balanced diet is very important for your health and for your baby's health. You also have a higher need for some vitamins and minerals, such as folic acid, calcium, iron, and vitamin  D. What do I need to know about eating during pregnancy?  Do not try to lose weight or go on a diet during pregnancy.  Choose healthy, nutritious foods. Choose  of a sandwich with a glass of milk instead of a candy bar or a high-calorie sugar-sweetened beverage.  Limit your overall intake of foods that have "empty calories." These are foods that have little nutritional value, such as sweets, desserts, candies, sugar-sweetened beverages, and fried foods.  Eat a variety of foods, especially fruits and vegetables.  Take a prenatal vitamin to help meet the additional needs during pregnancy, specifically for folic acid, iron, calcium, and vitamin D.  Remember to stay active. Ask your health care provider for exercise recommendations that are specific to you.  Practice good food safety and cleanliness, such as washing your hands before you eat and after you prepare raw meat. This helps to prevent foodborne illnesses, such as listeriosis, that can be very dangerous for your baby. Ask your health care provider for more information about listeriosis. What does 150 extra calories look like? Healthy options for an additional 150 calories each day could be any of the following:  Plain low-fat yogurt (6-8 oz) with  cup of berries.  1 apple with 2 teaspoons of peanut butter.  Cut-up vegetables with  cup of hummus.  Low-fat chocolate milk (8 oz or 1 cup).  1 string cheese with 1 medium orange.   of a peanut butter and jelly sandwich on whole-wheat bread (1 tsp of peanut butter).  For 300 calories, you could eat two of those healthy options each day. What is a healthy amount of weight to gain? The recommended amount of weight for you to gain is based on your pre-pregnancy BMI. If your pre-pregnancy BMI was:  Less than 18 (underweight), you should gain 28-40 lb.  18-24.9 (normal), you should gain 25-35 lb.  25-29.9 (overweight), you should gain 15-25 lb.  Greater than 30 (obese), you  should gain 11-20 lb.  What if I am having twins or multiples? Generally, pregnant women who will be having twins or multiples may need to increase their daily calories by 300-600 calories each day. The recommended range for total weight gain is 25-54 lb, depending on your pre-pregnancy BMI. Talk with your health care provider for specific guidance about additional nutritional needs, weight gain, and exercise during your pregnancy. What foods can I eat? Grains Any grains. Try to choose whole grains, such as whole-wheat bread, oatmeal, or brown rice. Vegetables Any vegetables. Try to eat a variety of colors and types  of vegetables to get a full range of vitamins and minerals. Remember to wash your vegetables well before eating. Fruits Any fruits. Try to eat a variety of colors and types of fruit to get a full range of vitamins and minerals. Remember to wash your fruits well before eating. Meats and Other Protein Sources Lean meats, including chicken, [place], fish, and lean cuts of beef, veal, or pork. Make sure that all meats are cooked to "well done." Tofu. Tempeh. Beans. Eggs. Peanut butter and other nut butters. Seafood, such as shrimp, crab, and lobster. If you choose fish, select types that are higher in omega-3 fatty acids, including salmon, herring, mussels, trout, sardines, and pollock. Make sure that all meats are cooked to food-safe temperatures. Dairy Pasteurized milk and milk alternatives. Pasteurized yogurt and pasteurized cheese. Cottage cheese. Sour cream. Beverages Water. Juices that contain 100% fruit juice or vegetable juice. Caffeine-free teas and decaffeinated coffee. Drinks that contain caffeine are okay to drink, but it is better to avoid caffeine. Keep your total caffeine intake to less than 200 mg each day (12 oz of coffee, tea, or soda) or as directed by your health care provider. Condiments Any pasteurized condiments. Sweets and Desserts Any sweets and desserts. Fats and  Oils Any fats and oils. The items listed above may not be a complete list of recommended foods or beverages. Contact your dietitian for more options. What foods are not recommended? Vegetables Unpasteurized (raw) vegetable juices. Fruits Unpasteurized (raw) fruit juices. Meats and Other Protein Sources Cured meats that have nitrates, such as bacon, salami, and hotdogs. Luncheon meats, bologna, or other deli meats (unless they are reheated until they are steaming hot). Refrigerated pate, meat spreads from a meat counter, smoked seafood that is found in the refrigerated section of a store. Raw fish, such as sushi or sashimi. High mercury content fish, such as tilefish, shark, swordfish, and king mackerel. Raw meats, such as tuna or beef tartare. Undercooked meats and poultry. Make sure that all meats are cooked to food-safe temperatures. Dairy Unpasteurized (raw) milk and any foods that have raw milk in them. Soft cheeses, such as feta, queso blanco, queso fresco, Brie, Camembert cheeses, blue-veined cheeses, and Panela cheese (unless it is made with pasteurized milk, which must be stated on the label). Beverages Alcohol. Sugar-sweetened beverages, such as sodas, teas, or energy drinks. Condiments Homemade fermented foods and drinks, such as pickles, sauerkraut, or kombucha drinks. (Store-bought pasteurized versions of these are okay.) Other Salads that are made in the store, such as ham salad, chicken salad, egg salad, tuna salad, and seafood salad. The items listed above may not be a complete list of foods and beverages to avoid. Contact your dietitian for more information. This information is not intended to replace advice given to you by your health care provider. Make sure you discuss any questions you have with your health care provider. Document Released: 07/13/2014 Document Revised: 03/05/2016 Document Reviewed: 03/13/2014 Elsevier Interactive Patient Education  [employer].

## 2017-10-21 ENCOUNTER — Telehealth: Payer: Self-pay | Admitting: Certified Nurse Midwife

## 2017-10-21 ENCOUNTER — Telehealth: Payer: Self-pay

## 2017-10-21 NOTE — Telephone Encounter (Signed)
mychart message left

## 2017-10-21 NOTE — Telephone Encounter (Signed)
The patient called and stated that she is having severe pain/cramping in her lower back and abdomin, The patient would like to have a nurse call her as soon as possible to determine if her appointment should be moved up to a sooner date. No other information was disclosed. Please advise.

## 2017-10-29 ENCOUNTER — Encounter: Payer: Self-pay | Admitting: Certified Nurse Midwife

## 2017-10-29 ENCOUNTER — Ambulatory Visit (INDEPENDENT_AMBULATORY_CARE_PROVIDER_SITE_OTHER): Payer: 59 | Admitting: Certified Nurse Midwife

## 2017-10-29 VITALS — BP 116/80 | HR 90 | Wt 264.5 lb

## 2017-10-29 DIAGNOSIS — Z3493 Encounter for supervision of normal pregnancy, unspecified, third trimester: Secondary | ICD-10-CM

## 2017-10-29 LAB — POCT URINALYSIS DIPSTICK
BILIRUBIN UA: NEGATIVE
Glucose, UA: NEGATIVE
Ketones, UA: NEGATIVE
Nitrite, UA: NEGATIVE
PH UA: 7 (ref 5.0–8.0)
Protein, UA: NEGATIVE
RBC UA: NEGATIVE
Spec Grav, UA: 1.015 (ref 1.010–1.025)
UROBILINOGEN UA: 0.2 U/dL

## 2017-10-29 NOTE — Progress Notes (Signed)
ROB- pt is doing ok, having some swelling B feet

## 2017-10-29 NOTE — Progress Notes (Signed)
ROB, doing well. No complaints. Discussed GBS next week. She verbalizes understanding and agrees to plan Follow up 1 wk.   Doreene BurkeAnnie Bravlio Luca, CNM

## 2017-10-29 NOTE — Patient Instructions (Signed)
Group B Streptococcus Infection During Pregnancy Group B Streptococcus (GBS) is a type of bacteria (Streptococcus agalactiae) that is often found in healthy people, commonly in the rectum, vagina, and intestines. In people who are healthy and not pregnant, the bacteria rarely cause serious illness or complications. However, women who test positive for GBS during pregnancy can pass the bacteria to their baby during childbirth, which can cause serious infection in the baby after birth. Women with GBS may also have infections during their pregnancy or immediately after childbirth, such as such as urinary tract infections (UTIs) or infections of the uterus (uterine infections). Having GBS also increases a woman's risk of complications during pregnancy, such as early (preterm) labor or delivery, miscarriage, or stillbirth. Routine testing (screening) for GBS is recommended for all pregnant women. What increases the risk? You may have a higher risk for GBS infection during pregnancy if you had one during a past pregnancy. What are the signs or symptoms? In most cases, GBS infection does not cause symptoms in pregnant women. Signs and symptoms of a possible GBS-related infection may include:  Labor starting before the 37th week of pregnancy.  A UTI or bladder infection, which may cause: ? Fever. ? Pain or burning during urination. ? Frequent urination.  Fever during labor, along with: ? Bad-smelling discharge. ? Uterine tenderness. ? Rapid heartbeat in the mother, baby, or both.  Rare but serious symptoms of a possible GBS-related infection in women include:  Blood infection (septicemia). This may cause fever, chills, or confusion.  Lung infection (pneumonia). This may cause fever, chills, cough, rapid breathing, difficulty breathing, or chest pain.  Bone, joint, skin, or soft tissue infection.  How is this diagnosed? You may be screened for GBS between week 35 and week 37 of your pregnancy. If  you have symptoms of preterm labor, you may be screened earlier. This condition is diagnosed based on lab test results from:  A swab of fluid from the vagina and rectum.  A urine sample.  How is this treated? This condition is treated with antibiotic medicine. When you go into labor, or as soon as your water breaks (your membranes rupture), you will be given antibiotics through an IV tube. Antibiotics will continue until after you give birth. If you are having a cesarean delivery, you do not need antibiotics unless your membranes have already ruptured. Follow these instructions at home:  Take over-the-counter and prescription medicines only as told by your health care provider.  Take your antibiotic medicine as told by your health care provider. Do not stop taking the antibiotic even if you start to feel better.  Keep all pre-birth (prenatal) visits and follow-up visits as told by your health care provider. This is important. Contact a health care provider if:  You have pain or burning when you urinate.  You have to urinate frequently.  You have a fever or chills.  You develop a bad-smelling vaginal discharge. Get help right away if:  Your membranes rupture.  You go into labor.  You have severe pain in your abdomen.  You have difficulty breathing.  You have chest pain. This information is not intended to replace advice given to you by your health care provider. Make sure you discuss any questions you have with your health care provider. Document Released: 01/05/2008 Document Revised: 04/24/2016 Document Reviewed: 04/23/2016 Elsevier Interactive Patient Education  2018 [employer] Inc.  

## 2017-11-05 ENCOUNTER — Ambulatory Visit (INDEPENDENT_AMBULATORY_CARE_PROVIDER_SITE_OTHER): Payer: 59 | Admitting: Certified Nurse Midwife

## 2017-11-05 VITALS — BP 114/84 | HR 96 | Wt 267.1 lb

## 2017-11-05 DIAGNOSIS — Z3493 Encounter for supervision of normal pregnancy, unspecified, third trimester: Secondary | ICD-10-CM

## 2017-11-05 LAB — POCT URINALYSIS DIPSTICK
Bilirubin, UA: NEGATIVE
Blood, UA: NEGATIVE
Glucose, UA: NEGATIVE
KETONES UA: NEGATIVE
LEUKOCYTES UA: NEGATIVE
NITRITE UA: NEGATIVE
PH UA: 5 (ref 5.0–8.0)
PROTEIN UA: NEGATIVE
Spec Grav, UA: 1.02 (ref 1.010–1.025)
UROBILINOGEN UA: 0.2 U/dL

## 2017-11-05 NOTE — Progress Notes (Signed)
Pt is here for an ROB visit. Is due for GCC/GBS cultures.

## 2017-11-05 NOTE — Patient Instructions (Signed)
Group B Streptococcus Infection During Pregnancy Group B Streptococcus (GBS) is a type of bacteria (Streptococcus agalactiae) that is often found in healthy people, commonly in the rectum, vagina, and intestines. In people who are healthy and not pregnant, the bacteria rarely cause serious illness or complications. However, women who test positive for GBS during pregnancy can pass the bacteria to their baby during childbirth, which can cause serious infection in the baby after birth. Women with GBS may also have infections during their pregnancy or immediately after childbirth, such as such as urinary tract infections (UTIs) or infections of the uterus (uterine infections). Having GBS also increases a woman's risk of complications during pregnancy, such as early (preterm) labor or delivery, miscarriage, or stillbirth. Routine testing (screening) for GBS is recommended for all pregnant women. What increases the risk? You may have a higher risk for GBS infection during pregnancy if you had one during a past pregnancy. What are the signs or symptoms? In most cases, GBS infection does not cause symptoms in pregnant women. Signs and symptoms of a possible GBS-related infection may include:  Labor starting before the 37th week of pregnancy.  A UTI or bladder infection, which may cause: ? Fever. ? Pain or burning during urination. ? Frequent urination.  Fever during labor, along with: ? Bad-smelling discharge. ? Uterine tenderness. ? Rapid heartbeat in the mother, baby, or both.  Rare but serious symptoms of a possible GBS-related infection in women include:  Blood infection (septicemia). This may cause fever, chills, or confusion.  Lung infection (pneumonia). This may cause fever, chills, cough, rapid breathing, difficulty breathing, or chest pain.  Bone, joint, skin, or soft tissue infection.  How is this diagnosed? You may be screened for GBS between week 35 and week 37 of your pregnancy. If  you have symptoms of preterm labor, you may be screened earlier. This condition is diagnosed based on lab test results from:  A swab of fluid from the vagina and rectum.  A urine sample.  How is this treated? This condition is treated with antibiotic medicine. When you go into labor, or as soon as your water breaks (your membranes rupture), you will be given antibiotics through an IV tube. Antibiotics will continue until after you give birth. If you are having a cesarean delivery, you do not need antibiotics unless your membranes have already ruptured. Follow these instructions at home:  Take over-the-counter and prescription medicines only as told by your health care provider.  Take your antibiotic medicine as told by your health care provider. Do not stop taking the antibiotic even if you start to feel better.  Keep all pre-birth (prenatal) visits and follow-up visits as told by your health care provider. This is important. Contact a health care provider if:  You have pain or burning when you urinate.  You have to urinate frequently.  You have a fever or chills.  You develop a bad-smelling vaginal discharge. Get help right away if:  Your membranes rupture.  You go into labor.  You have severe pain in your abdomen.  You have difficulty breathing.  You have chest pain. This information is not intended to replace advice given to you by your health care provider. Make sure you discuss any questions you have with your health care provider. Document Released: 01/05/2008 Document Revised: 04/24/2016 Document Reviewed: 04/23/2016 Elsevier Interactive Patient Education  2018 [employer] Inc.  

## 2017-11-05 NOTE — Progress Notes (Signed)
ROB, doing well. No complaints. Feels good fetal movement. GBS cultures today. SVE per pt request 1/thick/high.Follow up 1 wk. Body mass index is 41.84 kg/m.   Doreene BurkeAnnie Azell Bill, CNM

## 2017-11-07 LAB — STREP GP B NAA: STREP GROUP B AG: POSITIVE — AB

## 2017-11-08 ENCOUNTER — Encounter: Payer: Self-pay | Admitting: Certified Nurse Midwife

## 2017-11-12 ENCOUNTER — Ambulatory Visit (INDEPENDENT_AMBULATORY_CARE_PROVIDER_SITE_OTHER): Payer: 59 | Admitting: Certified Nurse Midwife

## 2017-11-12 VITALS — BP 128/82 | HR 106 | Wt 267.7 lb

## 2017-11-12 DIAGNOSIS — Z3493 Encounter for supervision of normal pregnancy, unspecified, third trimester: Secondary | ICD-10-CM

## 2017-11-12 LAB — POCT URINALYSIS DIPSTICK
Bilirubin, UA: NEGATIVE
Blood, UA: NEGATIVE
Glucose, UA: NEGATIVE
KETONES UA: NEGATIVE
Leukocytes, UA: NEGATIVE
Nitrite, UA: NEGATIVE
PH UA: 6 (ref 5.0–8.0)
Protein, UA: NEGATIVE
Spec Grav, UA: 1.01 (ref 1.010–1.025)
UROBILINOGEN UA: 0.2 U/dL

## 2017-11-12 NOTE — Progress Notes (Signed)
ROB- pt is having some pelvic pressure, gc/chl both negative

## 2017-11-12 NOTE — Patient Instructions (Signed)
Natural Childbirth Natural childbirth is going through labor and delivery without any drugs to relieve pain. Additionally, fetal monitors are not used, a delivery is not done, and a surgical cut to enlarge the vaginal opening (episiotomy) is not made. With the help of a birthing professional (midwife or health care provider), you direct your own labor and delivery. Many women choose natural childbirth because it makes them feel more in control and in touch with their labor and delivery. Some woman also choose natural childbirth because they are concerned about medicines affecting them and their babies. Pregnant women with a high-risk pregnancy should not attempt natural childbirth. It is better to deliver the infant in a hospital if an emergency situation arises. Sometimes, a health care provider has to get involved for the health and safety of the mother and infant. Techniques for natural childbirth  The Lamaze method-This method teaches parents that having a baby is normal, healthy, and natural. It also teaches the mother to take a neutral position regarding pain medicine and numbing medicines and to make an informed decision about using these medicines when the time comes.  The Bradley method (also called husband-coached birth)-This method teaches the father or partner to be the birth coach. It encourages the mother to exercise and eat a balanced, nutritious diet. It also involves relaxation and deep breathing exercises and preparing the parents for emergency situations. Methods of dealing with labor pain and delivery  Meditation.  Yoga.  Hypnosis.  Acupuncture.  Massage.  Changing positions (walking, rocking, showering, leaning on birth balls).  Lying in warm water or a whirlpool bath.  Finding an activity that keeps your mind off of the labor pain.  Listening to soft music.  Focusing on a particular object (visual imagery). Before going into labor  Be sure you and your spouse or  partner are in agreement about having a natural childbirth.  Decide if your health care provider or a midwife will deliver your baby.  Decide if you will have your baby in the hospital, at a birthing center, or at home.  If you have children, make plans to have someone take care of them when you go to the hospital or birthing center.  Know the distance and the time it takes to go to the hospital or birthing center. Practice going there and time it to be sure.  Have a bag packed with a nightgown, bathrobe, and toiletries. Be ready to take it with you when you go into labor.  Keep phone numbers of your family and friends handy if you need to call someone when you go into labor.  Your spouse or partner should go to all the natural childbirth technique classes.  Talk with your health care provider about the possibility of a medical emergency and what will happen if that occurs. Advantages of natural childbirth  You are in control of your labor and delivery.  You will not take medicines that could affect you and the baby.  There are no invasive procedures, such as an episiotomy.  You and your spouse or partner will work together, which can increase your bond with each other.  In most delivery centers, your family and friends can be involved in the labor and delivery process. Disadvantages of natural childbirth  You will experience pain during your labor and delivery.  The methods of helping relieve your labor pains may not work for you.  You may feel disappointed if you decide to change your mind during labor and not   have a natural childbirth. After the delivery  You will be very tired.  You will be uncomfortable because of your uterus contracting. You will feel soreness around the vagina.  You may feel cold and shaky. This is a natural reaction. This information is not intended to replace advice given to you by your health care provider. Make sure you discuss any questions you  have with your health care provider. Document Released: 09/10/2008 Document Revised: 03/05/2016 Document Reviewed: 06/05/2013 Elsevier Interactive Patient Education  2017 [employer] Inc[employer]  

## 2017-11-13 NOTE — Progress Notes (Signed)
ROB-Doing well, ready to have baby. Previous children all born around 4839 weeks. Discussed GBS positive status and use of antibiotics in labor, patient verbalized understanding. Reviewed red flag symptoms and when to call. RTC x 1 week for ROB or sooner if needed.

## 2017-11-19 ENCOUNTER — Ambulatory Visit (INDEPENDENT_AMBULATORY_CARE_PROVIDER_SITE_OTHER): Payer: 59 | Admitting: Certified Nurse Midwife

## 2017-11-19 ENCOUNTER — Encounter: Payer: Self-pay | Admitting: Certified Nurse Midwife

## 2017-11-19 VITALS — BP 116/82 | HR 89 | Wt 278.1 lb

## 2017-11-19 DIAGNOSIS — Z3493 Encounter for supervision of normal pregnancy, unspecified, third trimester: Secondary | ICD-10-CM

## 2017-11-19 LAB — POCT URINALYSIS DIPSTICK
Bilirubin, UA: NEGATIVE
Blood, UA: NEGATIVE
Glucose, UA: NEGATIVE
LEUKOCYTES UA: NEGATIVE
NITRITE UA: NEGATIVE
PROTEIN UA: NEGATIVE
Spec Grav, UA: 1.02 (ref 1.010–1.025)
UROBILINOGEN UA: 0.2 U/dL
pH, UA: 6 (ref 5.0–8.0)

## 2017-11-19 LAB — GC/CHLAMYDIA PROBE AMP
CHLAMYDIA, DNA PROBE: NEGATIVE
NEISSERIA GONORRHOEAE BY PCR: NEGATIVE

## 2017-11-19 NOTE — Progress Notes (Signed)
ROB, doing well. Complains of swelling. 1= bilaterally noted. No headaches, no visual disturbances , no epigastric pain . She feels good fetal movement . SVE today 1/50/-2. Labor precautions reviewed. Follow up 1 wks.   Doreene BurkeAnnie Calyn Rubi, CNM

## 2017-11-19 NOTE — Patient Instructions (Signed)
Braxton Hicks Contractions °Contractions of the uterus can occur throughout pregnancy, but they are not always a sign that you are in labor. You may have practice contractions called Braxton Hicks contractions. These false labor contractions are sometimes confused with true labor. °What are Braxton Hicks contractions? °Braxton Hicks contractions are tightening movements that occur in the muscles of the uterus before labor. Unlike true labor contractions, these contractions do not result in opening (dilation) and thinning of the cervix. Toward the end of pregnancy (32-34 weeks), Braxton Hicks contractions can happen more often and may become stronger. These contractions are sometimes difficult to tell apart from true labor because they can be very uncomfortable. You should not feel embarrassed if you go to the hospital with false labor. °Sometimes, the only way to tell if you are in true labor is for your health care provider to look for changes in the cervix. The health care provider will do a physical exam and may monitor your contractions. If you are not in true labor, the exam should show that your cervix is not dilating and your water has not broken. °If there are other health problems associated with your pregnancy, it is completely safe for you to be sent home with false labor. You may continue to have Braxton Hicks contractions until you go into true labor. °How to tell the difference between true labor and false labor °True labor °· Contractions last 30-70 seconds. °· Contractions become very regular. °· Discomfort is usually felt in the top of the uterus, and it spreads to the lower abdomen and low back. °· Contractions do not go away with walking. °· Contractions usually become more intense and increase in frequency. °· The cervix dilates and gets thinner. °False labor °· Contractions are usually shorter and not as strong as true labor contractions. °· Contractions are usually irregular. °· Contractions  are often felt in the front of the lower abdomen and in the groin. °· Contractions may go away when you walk around or change positions while lying down. °· Contractions get weaker and are shorter-lasting as time goes on. °· The cervix usually does not dilate or become thin. °Follow these instructions at home: °· Take over-the-counter and prescription medicines only as told by your health care provider. °· Keep up with your usual exercises and follow other instructions from your health care provider. °· Eat and drink lightly if you think you are going into labor. °· If Braxton Hicks contractions are making you uncomfortable: °? Change your position from lying down or resting to walking, or change from walking to resting. °? Sit and rest in a tub of warm water. °? Drink enough fluid to keep your urine pale yellow. Dehydration may cause these contractions. °? Do slow and deep breathing several times an hour. °· Keep all follow-up prenatal visits as told by your health care provider. This is important. °Contact a health care provider if: °· You have a fever. °· You have continuous pain in your abdomen. °Get help right away if: °· Your contractions become stronger, more regular, and closer together. °· You have fluid leaking or gushing from your vagina. °· You pass blood-tinged mucus (bloody show). °· You have bleeding from your vagina. °· You have low back pain that you never had before. °· You feel your baby’s head pushing down and causing pelvic pressure. °· Your baby is not moving inside you as much as it used to. °Summary °· Contractions that occur before labor are called Braxton   Hicks contractions, false labor, or practice contractions. °· Braxton Hicks contractions are usually shorter, weaker, farther apart, and less regular than true labor contractions. True labor contractions usually become progressively stronger and regular and they become more frequent. °· Manage discomfort from Braxton Hicks contractions by  changing position, resting in a warm bath, drinking plenty of water, or practicing deep breathing. °This information is not intended to replace advice given to you by your health care provider. Make sure you discuss any questions you have with your health care provider. °Document Released: 02/11/2017 Document Revised: 02/11/2017 Document Reviewed: 02/11/2017 °Elsevier Interactive Patient Education © 2018 Elsevier Inc. ° °

## 2017-11-19 NOTE — Progress Notes (Signed)
Pt is here for an ROB visit. Is c/o swelling.

## 2017-11-26 ENCOUNTER — Encounter: Payer: 59 | Admitting: Certified Nurse Midwife

## 2017-11-29 ENCOUNTER — Ambulatory Visit (INDEPENDENT_AMBULATORY_CARE_PROVIDER_SITE_OTHER): Payer: 59 | Admitting: Certified Nurse Midwife

## 2017-11-29 ENCOUNTER — Encounter: Payer: Self-pay | Admitting: Certified Nurse Midwife

## 2017-11-29 VITALS — BP 127/84 | HR 75 | Wt 280.1 lb

## 2017-11-29 DIAGNOSIS — Z3493 Encounter for supervision of normal pregnancy, unspecified, third trimester: Secondary | ICD-10-CM

## 2017-11-29 DIAGNOSIS — Z3A39 39 weeks gestation of pregnancy: Secondary | ICD-10-CM

## 2017-11-29 LAB — POCT URINALYSIS DIPSTICK
BILIRUBIN UA: NEGATIVE
Blood, UA: NEGATIVE
Glucose, UA: NEGATIVE
Ketones, UA: NEGATIVE
Leukocytes, UA: NEGATIVE
Nitrite, UA: NEGATIVE
Protein, UA: NEGATIVE
Spec Grav, UA: 1.02 (ref 1.010–1.025)
Urobilinogen, UA: 0.2 E.U./dL
pH, UA: 6.5 (ref 5.0–8.0)

## 2017-11-29 NOTE — Progress Notes (Signed)
Pt is here for an ROB visit. 

## 2017-11-29 NOTE — Progress Notes (Signed)
ROB doing well. Feels good movement. Discussed NST at next visit then ROB. Will scheduled induction at next appointment.   Doreene BurkeAnnie Gloristine Turrubiates, CNM

## 2017-11-29 NOTE — Patient Instructions (Signed)
Braxton Hicks Contractions °Contractions of the uterus can occur throughout pregnancy, but they are not always a sign that you are in labor. You may have practice contractions called Braxton Hicks contractions. These false labor contractions are sometimes confused with true labor. °What are Braxton Hicks contractions? °Braxton Hicks contractions are tightening movements that occur in the muscles of the uterus before labor. Unlike true labor contractions, these contractions do not result in opening (dilation) and thinning of the cervix. Toward the end of pregnancy (32-34 weeks), Braxton Hicks contractions can happen more often and may become stronger. These contractions are sometimes difficult to tell apart from true labor because they can be very uncomfortable. You should not feel embarrassed if you go to the hospital with false labor. °Sometimes, the only way to tell if you are in true labor is for your health care provider to look for changes in the cervix. The health care provider will do a physical exam and may monitor your contractions. If you are not in true labor, the exam should show that your cervix is not dilating and your water has not broken. °If there are other health problems associated with your pregnancy, it is completely safe for you to be sent home with false labor. You may continue to have Braxton Hicks contractions until you go into true labor. °How to tell the difference between true labor and false labor °True labor °· Contractions last 30-70 seconds. °· Contractions become very regular. °· Discomfort is usually felt in the top of the uterus, and it spreads to the lower abdomen and low back. °· Contractions do not go away with walking. °· Contractions usually become more intense and increase in frequency. °· The cervix dilates and gets thinner. °False labor °· Contractions are usually shorter and not as strong as true labor contractions. °· Contractions are usually irregular. °· Contractions  are often felt in the front of the lower abdomen and in the groin. °· Contractions may go away when you walk around or change positions while lying down. °· Contractions get weaker and are shorter-lasting as time goes on. °· The cervix usually does not dilate or become thin. °Follow these instructions at home: °· Take over-the-counter and prescription medicines only as told by your health care provider. °· Keep up with your usual exercises and follow other instructions from your health care provider. °· Eat and drink lightly if you think you are going into labor. °· If Braxton Hicks contractions are making you uncomfortable: °? Change your position from lying down or resting to walking, or change from walking to resting. °? Sit and rest in a tub of warm water. °? Drink enough fluid to keep your urine pale yellow. Dehydration may cause these contractions. °? Do slow and deep breathing several times an hour. °· Keep all follow-up prenatal visits as told by your health care provider. This is important. °Contact a health care provider if: °· You have a fever. °· You have continuous pain in your abdomen. °Get help right away if: °· Your contractions become stronger, more regular, and closer together. °· You have fluid leaking or gushing from your vagina. °· You pass blood-tinged mucus (bloody show). °· You have bleeding from your vagina. °· You have low back pain that you never had before. °· You feel your baby’s head pushing down and causing pelvic pressure. °· Your baby is not moving inside you as much as it used to. °Summary °· Contractions that occur before labor are called Braxton   Hicks contractions, false labor, or practice contractions. °· Braxton Hicks contractions are usually shorter, weaker, farther apart, and less regular than true labor contractions. True labor contractions usually become progressively stronger and regular and they become more frequent. °· Manage discomfort from Braxton Hicks contractions by  changing position, resting in a warm bath, drinking plenty of water, or practicing deep breathing. °This information is not intended to replace advice given to you by your health care provider. Make sure you discuss any questions you have with your health care provider. °Document Released: 02/11/2017 Document Revised: 02/11/2017 Document Reviewed: 02/11/2017 °Elsevier Interactive Patient Education © 2018 Elsevier Inc. ° °

## 2017-12-06 ENCOUNTER — Other Ambulatory Visit: Payer: 59

## 2017-12-06 ENCOUNTER — Ambulatory Visit (INDEPENDENT_AMBULATORY_CARE_PROVIDER_SITE_OTHER): Payer: 59 | Admitting: Certified Nurse Midwife

## 2017-12-06 VITALS — BP 115/91 | HR 94 | Wt 280.9 lb

## 2017-12-06 DIAGNOSIS — Z3483 Encounter for supervision of other normal pregnancy, third trimester: Secondary | ICD-10-CM | POA: Diagnosis not present

## 2017-12-06 LAB — POCT URINALYSIS DIPSTICK
Bilirubin, UA: NEGATIVE
Blood, UA: NEGATIVE
Glucose, UA: NEGATIVE
KETONES UA: NEGATIVE
NITRITE UA: NEGATIVE
PROTEIN UA: NEGATIVE
Spec Grav, UA: 1.015 (ref 1.010–1.025)
Urobilinogen, UA: 0.2 E.U./dL
pH, UA: 7 (ref 5.0–8.0)

## 2017-12-06 NOTE — Progress Notes (Signed)
Pt is having back pain, swelling in feet, legs and hands. No other concerns.

## 2017-12-06 NOTE — Patient Instructions (Signed)
Labor Induction Labor induction is when steps are taken to cause a pregnant woman to begin the labor process. Most women go into labor on their own between 37 weeks and 42 weeks of the pregnancy. When this does not happen or when there is a medical need, methods may be used to induce labor. Labor induction causes a pregnant woman's uterus to contract. It also causes the cervix to soften (ripen), open (dilate), and thin out (efface). Usually, labor is not induced before 39 weeks of the pregnancy unless there is a problem with the baby or mother. Before inducing labor, your health care provider will consider a number of factors, including the following:  The medical condition of you and the baby.  How many weeks along you are.  The status of the baby's lung maturity.  The condition of the cervix.  The position of the baby. What are the reasons for labor induction? Labor may be induced for the following reasons:  The health of the baby or mother is at risk.  The pregnancy is overdue by 1 week or more.  The water breaks but labor does not start on its own.  The mother has a health condition or serious illness, such as high blood pressure, infection, placental abruption, or diabetes.  The amniotic fluid amounts are low around the baby.  The baby is distressed. Convenience or wanting the baby to be born on a certain date is not a reason for inducing labor. What methods are used for labor induction? Several methods of labor induction may be used, such as:  Prostaglandin medicine. This medicine causes the cervix to dilate and ripen. The medicine will also start contractions. It can be taken by mouth or by inserting a suppository into the vagina.  Inserting a thin tube (catheter) with a balloon on the end into the vagina to dilate the cervix. Once inserted, the balloon is expanded with water, which causes the cervix to open.  Stripping the membranes. Your health care provider separates  amniotic sac tissue from the cervix, causing the cervix to be stretched and causing the release of a hormone called progesterone. This may cause the uterus to contract. It is often done during an office visit. You will be sent home to wait for the contractions to begin. You will then come in for an induction.  Breaking the water. Your health care provider makes a hole in the amniotic sac using a small instrument. Once the amniotic sac breaks, contractions should begin. This may still take hours to see an effect.  Medicine to trigger or strengthen contractions. This medicine is given through an IV access tube inserted into a vein in your arm. All of the methods of induction, besides stripping the membranes, will be done in the hospital. Induction is done in the hospital so that you and the baby can be carefully monitored. How long does it take for labor to be induced? Some inductions can take up to 2-3 days. Depending on the cervix, it usually takes less time. It takes longer when you are induced early in the pregnancy or if this is your first pregnancy. If a mother is still pregnant and the induction has been going on for 2-3 days, either the mother will be sent home or a cesarean delivery will be needed. What are the risks associated with labor induction? Some of the risks of induction include:  Changes in fetal heart rate, such as too high, too low, or erratic.  Fetal distress.    Chance of infection for the mother and baby.  Increased chance of having a cesarean delivery.  Breaking off (abruption) of the placenta from the uterus (rare).  Uterine rupture (very rare). When induction is needed for medical reasons, the benefits of induction may outweigh the risks. What are some reasons for not inducing labor? Labor induction should not be done if:  It is shown that your baby does not tolerate labor.  You have had previous surgeries on your uterus, such as a myomectomy or the removal of  fibroids.  Your placenta lies very low in the uterus and blocks the opening of the cervix (placenta previa).  Your baby is not in a head-down position.  The umbilical cord drops down into the birth canal in front of the baby. This could cut off the baby's blood and oxygen supply.  You have had a previous cesarean delivery.  There are unusual circumstances, such as the baby being extremely premature. This information is not intended to replace advice given to you by your health care provider. Make sure you discuss any questions you have with your health care provider. Document Released: 02/17/2007 Document Revised: 03/05/2016 Document Reviewed: 04/27/2013 Elsevier Interactive Patient Education  2017 Elsevier Inc.  

## 2017-12-06 NOTE — Progress Notes (Signed)
ROB-NST performed today was reviewed and was found to be reactive. Baseline 140 with Moderate variability; No decels noted. Blood pressures: 133/83, 136/65, 142/76. Reviewed red flag symptoms and when to call. IOL scheduled for Wednesday, 12/08/2017 at 0500. RTC x 6 weeks for PPV or sooner if needed.

## 2017-12-08 ENCOUNTER — Inpatient Hospital Stay
Admission: AD | Admit: 2017-12-08 | Discharge: 2017-12-10 | DRG: 807 | Disposition: A | Discharge status: Home / Self Care | Payer: 59 | Source: Ambulatory Visit | Attending: Certified Nurse Midwife | Admitting: Certified Nurse Midwife

## 2017-12-08 ENCOUNTER — Other Ambulatory Visit: Payer: Self-pay

## 2017-12-08 DIAGNOSIS — E669 Obesity, unspecified: Secondary | ICD-10-CM | POA: Diagnosis present

## 2017-12-08 DIAGNOSIS — Z6841 Body Mass Index (BMI) 40.0 and over, adult: Secondary | ICD-10-CM

## 2017-12-08 DIAGNOSIS — O26893 Other specified pregnancy related conditions, third trimester: Secondary | ICD-10-CM | POA: Diagnosis present

## 2017-12-08 DIAGNOSIS — Z87891 Personal history of nicotine dependence: Secondary | ICD-10-CM

## 2017-12-08 DIAGNOSIS — Z3A40 40 weeks gestation of pregnancy: Secondary | ICD-10-CM

## 2017-12-08 DIAGNOSIS — O99824 Streptococcus B carrier state complicating childbirth: Secondary | ICD-10-CM | POA: Diagnosis present

## 2017-12-08 DIAGNOSIS — O99214 Obesity complicating childbirth: Secondary | ICD-10-CM | POA: Diagnosis present

## 2017-12-08 DIAGNOSIS — D649 Anemia, unspecified: Secondary | ICD-10-CM | POA: Diagnosis not present

## 2017-12-08 DIAGNOSIS — O0930 Supervision of pregnancy with insufficient antenatal care, unspecified trimester: Secondary | ICD-10-CM

## 2017-12-08 DIAGNOSIS — O9081 Anemia of the puerperium: Secondary | ICD-10-CM | POA: Diagnosis not present

## 2017-12-08 DIAGNOSIS — Z3490 Encounter for supervision of normal pregnancy, unspecified, unspecified trimester: Secondary | ICD-10-CM | POA: Diagnosis present

## 2017-12-08 LAB — URINE DRUG SCREEN, QUALITATIVE (ARMC ONLY)
Amphetamines, Ur Screen: NOT DETECTED
BARBITURATES, UR SCREEN: NOT DETECTED
BENZODIAZEPINE, UR SCRN: NOT DETECTED
CANNABINOID 50 NG, UR ~~LOC~~: NOT DETECTED
COCAINE METABOLITE, UR ~~LOC~~: NOT DETECTED
MDMA (Ecstasy)Ur Screen: NOT DETECTED
METHADONE SCREEN, URINE: NOT DETECTED
Opiate, Ur Screen: NOT DETECTED
Phencyclidine (PCP) Ur S: NOT DETECTED
TRICYCLIC, UR SCREEN: NOT DETECTED

## 2017-12-08 LAB — CBC
HCT: 33.9 % — ABNORMAL LOW (ref 35.0–47.0)
Hemoglobin: 11.4 g/dL — ABNORMAL LOW (ref 12.0–16.0)
MCH: 28.5 pg (ref 26.0–34.0)
MCHC: 33.7 g/dL (ref 32.0–36.0)
MCV: 84.7 fL (ref 80.0–100.0)
PLATELETS: 168 10*3/uL (ref 150–440)
RBC: 4.01 MIL/uL (ref 3.80–5.20)
RDW: 14.4 % (ref 11.5–14.5)
WBC: 9.2 10*3/uL (ref 3.6–11.0)

## 2017-12-08 LAB — TYPE AND SCREEN
ABO/RH(D): A POS
ANTIBODY SCREEN: NEGATIVE

## 2017-12-08 LAB — PROTEIN / CREATININE RATIO, URINE
Creatinine, Urine: 133 mg/dL
PROTEIN CREATININE RATIO: 0.13 mg/mg{creat} (ref 0.00–0.15)
Total Protein, Urine: 17 mg/dL

## 2017-12-08 LAB — COMPREHENSIVE METABOLIC PANEL
ALBUMIN: 2.8 g/dL — AB (ref 3.5–5.0)
ALT: 15 U/L (ref 14–54)
ANION GAP: 9 (ref 5–15)
AST: 34 U/L (ref 15–41)
Alkaline Phosphatase: 120 U/L (ref 38–126)
BILIRUBIN TOTAL: 0.4 mg/dL (ref 0.3–1.2)
BUN: 9 mg/dL (ref 6–20)
CHLORIDE: 109 mmol/L (ref 101–111)
CO2: 18 mmol/L — AB (ref 22–32)
Calcium: 8.6 mg/dL — ABNORMAL LOW (ref 8.9–10.3)
Creatinine, Ser: 0.64 mg/dL (ref 0.44–1.00)
GFR calc Af Amer: >60 mL/min (ref >60)
GFR calc non Af Amer: >60 mL/min (ref >60)
GLUCOSE: 99 mg/dL (ref 65–99)
POTASSIUM: 4 mmol/L (ref 3.5–5.1)
SODIUM: 136 mmol/L (ref 135–145)
TOTAL PROTEIN: 6.5 g/dL (ref 6.5–8.1)

## 2017-12-08 MED ORDER — SOD CITRATE-CITRIC ACID 500-334 MG/5ML PO SOLN: 30.0000 mL | ORAL | Status: DC | PRN

## 2017-12-08 MED ORDER — OXYTOCIN 40 UNITS IN LACTATED RINGERS INFUSION - SIMPLE MED
2.5000 [IU]/h | INTRAVENOUS | Status: DC
  Administered 2017-12-08: 2.5 [IU]/h via INTRAVENOUS

## 2017-12-08 MED ORDER — IBUPROFEN 600 MG PO TABS
600.0000 mg | ORAL_TABLET | Freq: Four times a day (QID) | ORAL | Status: DC
  Administered 2017-12-09 – 2017-12-10 (×7): 600 mg via ORAL
  Filled 2017-12-08 (×6): qty 1

## 2017-12-08 MED ORDER — LIDOCAINE HCL (PF) 1 % IJ SOLN
30.0000 mL | INTRAMUSCULAR | Status: AC | PRN
  Administered 2017-12-08: 30 mL via SUBCUTANEOUS

## 2017-12-08 MED ORDER — ONDANSETRON HCL 4 MG/2ML IJ SOLN: 4.0000 mg | INTRAMUSCULAR | Status: DC | PRN

## 2017-12-08 MED ORDER — OXYTOCIN 40 UNITS IN LACTATED RINGERS INFUSION - SIMPLE MED
1.0000 m[IU]/min | INTRAVENOUS | Status: DC
  Filled 2017-12-08: qty 1000

## 2017-12-08 MED ORDER — DIBUCAINE 1 % RE OINT
1.0000 "application " | TOPICAL_OINTMENT | RECTAL | Status: DC | PRN
  Administered 2017-12-08: 1 via RECTAL
  Filled 2017-12-08: qty 28

## 2017-12-08 MED ORDER — WITCH HAZEL-GLYCERIN EX PADS
1.0000 "application " | MEDICATED_PAD | CUTANEOUS | Status: DC | PRN
  Administered 2017-12-08: 1 via TOPICAL
  Filled 2017-12-08: qty 100

## 2017-12-08 MED ORDER — LACTATED RINGERS IV SOLN: 500.0000 mL | INTRAVENOUS | Status: DC | PRN

## 2017-12-08 MED ORDER — ACETAMINOPHEN 325 MG PO TABS
650.0000 mg | ORAL_TABLET | ORAL | Status: DC | PRN
  Administered 2017-12-08 – 2017-12-09 (×4): 650 mg via ORAL
  Filled 2017-12-08 (×4): qty 2

## 2017-12-08 MED ORDER — BUTORPHANOL TARTRATE 1 MG/ML IJ SOLN: 1.0000 mg | INTRAMUSCULAR | Status: DC | PRN

## 2017-12-08 MED ORDER — MISOPROSTOL 25 MCG QUARTER TABLET
50.0000 ug | ORAL_TABLET | ORAL | Status: DC
  Administered 2017-12-08: 50 ug via VAGINAL
  Filled 2017-12-08: qty 1

## 2017-12-08 MED ORDER — ZOLPIDEM TARTRATE 5 MG PO TABS: 5.0000 mg | ORAL_TABLET | Freq: Every evening | ORAL | Status: DC | PRN

## 2017-12-08 MED ORDER — OXYCODONE-ACETAMINOPHEN 5-325 MG PO TABS: 1.0000 | ORAL_TABLET | ORAL | Status: DC | PRN

## 2017-12-08 MED ORDER — TERBUTALINE SULFATE 1 MG/ML IJ SOLN: 0.2500 mg | Freq: Once | INTRAMUSCULAR | Status: DC | PRN

## 2017-12-08 MED ORDER — COCONUT OIL OIL
1.0000 "application " | TOPICAL_OIL | Status: DC | PRN
  Filled 2017-12-08: qty 120

## 2017-12-08 MED ORDER — DIPHENHYDRAMINE HCL 25 MG PO CAPS: 25.0000 mg | ORAL_CAPSULE | Freq: Four times a day (QID) | ORAL | Status: DC | PRN

## 2017-12-08 MED ORDER — CYCLOBENZAPRINE HCL 10 MG PO TABS
10.0000 mg | ORAL_TABLET | Freq: Once | ORAL | Status: AC
  Administered 2017-12-08: 10 mg via ORAL
  Filled 2017-12-08: qty 1

## 2017-12-08 MED ORDER — MISOPROSTOL 200 MCG PO TABS
800.0000 ug | ORAL_TABLET | Freq: Once | ORAL | Status: AC
  Administered 2017-12-08: 800 ug via ORAL

## 2017-12-08 MED ORDER — IBUPROFEN 800 MG PO TABS
800.0000 mg | ORAL_TABLET | Freq: Once | ORAL | Status: AC
  Administered 2017-12-08: 800 mg via ORAL
  Filled 2017-12-08: qty 1

## 2017-12-08 MED ORDER — FLEET ENEMA 7-19 GM/118ML RE ENEM: 1.0000 | ENEMA | Freq: Every day | RECTAL | Status: DC | PRN

## 2017-12-08 MED ORDER — OXYTOCIN BOLUS FROM INFUSION
500.0000 mL | Freq: Once | INTRAVENOUS | Status: AC
  Administered 2017-12-08: 500 mL via INTRAVENOUS

## 2017-12-08 MED ORDER — OXYCODONE-ACETAMINOPHEN 5-325 MG PO TABS: 2.0000 | ORAL_TABLET | ORAL | Status: DC | PRN

## 2017-12-08 MED ORDER — BENZOCAINE-MENTHOL 20-0.5 % EX AERO: 1.0000 "application " | INHALATION_SPRAY | CUTANEOUS | Status: DC | PRN

## 2017-12-08 MED ORDER — PENICILLIN G POTASSIUM 5000000 UNITS IJ SOLR
5.0000 10*6.[IU] | Freq: Once | INTRAMUSCULAR | Status: AC
  Administered 2017-12-08: 5 10*6.[IU] via INTRAVENOUS
  Filled 2017-12-08: qty 5

## 2017-12-08 MED ORDER — ONDANSETRON HCL 4 MG/2ML IJ SOLN: 4.0000 mg | Freq: Four times a day (QID) | INTRAMUSCULAR | Status: DC | PRN

## 2017-12-08 MED ORDER — ONDANSETRON HCL 4 MG PO TABS: 4.0000 mg | ORAL_TABLET | ORAL | Status: DC | PRN

## 2017-12-08 MED ORDER — SIMETHICONE 80 MG PO CHEW
80.0000 mg | CHEWABLE_TABLET | ORAL | Status: DC | PRN
  Administered 2017-12-09 (×3): 80 mg via ORAL
  Filled 2017-12-08 (×3): qty 1

## 2017-12-08 MED ORDER — PRENATAL MULTIVITAMIN CH
1.0000 | ORAL_TABLET | Freq: Every day | ORAL | Status: DC
  Administered 2017-12-10: 1 via ORAL
  Filled 2017-12-08 (×2): qty 1

## 2017-12-08 MED ORDER — ACETAMINOPHEN 325 MG PO TABS
650.0000 mg | ORAL_TABLET | ORAL | Status: DC | PRN
  Administered 2017-12-08: 650 mg via ORAL
  Filled 2017-12-08: qty 2

## 2017-12-08 MED ORDER — SENNOSIDES-DOCUSATE SODIUM 8.6-50 MG PO TABS
2.0000 | ORAL_TABLET | ORAL | Status: DC
  Administered 2017-12-09 (×2): 2 via ORAL
  Filled 2017-12-08 (×2): qty 2

## 2017-12-08 MED ORDER — PENICILLIN G POT IN DEXTROSE 60000 UNIT/ML IV SOLN
3.0000 10*6.[IU] | INTRAVENOUS | Status: DC
  Administered 2017-12-08: 3 10*6.[IU] via INTRAVENOUS
  Filled 2017-12-08 (×8): qty 50

## 2017-12-08 MED ORDER — LACTATED RINGERS IV SOLN
INTRAVENOUS | Status: DC
  Administered 2017-12-08: 06:00:00 via INTRAVENOUS

## 2017-12-08 MED ORDER — LIDOCAINE 5 % EX PTCH
1.0000 | MEDICATED_PATCH | CUTANEOUS | Status: DC
  Administered 2017-12-08 – 2017-12-09 (×2): 1 via TRANSDERMAL
  Filled 2017-12-08 (×2): qty 1

## 2017-12-08 NOTE — H&P (Signed)
Obstetric History and Physical  Laurie Floyd is a 32 y.o. [id number] with IUP at 5330w5d presenting for induction of labor due to BMI>40. Patient states she has been having irregular contractions, none vaginal bleeding, intact membranes, with active fetal movement.    Denies difficulty breathing or respiratory distress, chest pain, abdominal pain, dysuria, and leg pain or swelling.   Prenatal Course  [facility]   Pregnancy complications or risks: late entry prenatal care, history of positive drug screen  Prenatal labs and studies:  ABO, Rh: --/--/A POS (02/27 0606)  Antibody: NEG (02/27 0606)  Rubella: 3.78 (08/28 1524)  Varicella: 1, 324 (08/28 1524)  RPR: Non Reactive (11/30 0844)   HBsAg: Negative (08/28 1524)   HIV: Non Reactive (08/28 1524)   JXB:JYNWGNFAGBS:Positive (01/25 1635)  1 hr Glucola: 97 (11/30 0844)  Genetic screening: Declined  Anatomy US: Complete and normal (10/31 1447)  Past Medical History:  Diagnosis Date  . Anxiety     Past Surgical History:  Procedure Laterality Date  . NO PAST SURGERIES      OB History  Gravida Para Term Preterm AB Living  5 3 3   1 3   SAB TAB Ectopic Multiple Live Births  1       3    # Outcome Date GA Lbr Len/2nd Weight Sex Delivery Anes PTL Lv  5 Current           4 Term 12/11/11 3619w0d  5 lb 2.1 oz (2.327 kg) M Vag-Spont  N LIV  3 Term 11/26/09 5479w0d  6 lb (2.722 kg) F Vag-Spont  N LIV  2 Term 01/17/08 3879w0d  7 lb (3.175 kg) M Vag-Spont   LIV  1 SAB 2006        ND      Social History   Socioeconomic History  . Marital status: Married    Spouse name: None  . Number of children: 3  . Years of education: None  . Highest education level: None  Social Needs  . Financial resource strain: None  . Food insecurity - worry: None  . Food insecurity - inability: None  . Transportation needs - medical: None  . Transportation needs - non-medical: None  Occupational History  . None  Tobacco Use  . Smoking status:  Former Smoker    Types: Cigarettes    Start date: 02/19/2017  . Smokeless tobacco: Never Used  Substance and Sexual Activity  . Alcohol use: No    Alcohol/week: 1.2 oz    Types: 2 Standard drinks or equivalent per week  . Drug use: No  . Sexual activity: Yes    Birth control/protection: Inserts    Comment: ring, user error  Other Topics Concern  . None  Social History Narrative  . None    Family History  Problem Relation Age of Onset  . Hypertension Mother   . Hypertension Father     Medications Prior to Admission  Medication Sig Dispense Refill Last Dose  . acetaminophen (TYLENOL) 500 MG tablet Take 500 mg by mouth every 6 (six) hours as needed.   Past Week at Unknown time  . Prenatal Vit-Fe Fumarate-FA (PRENATAL MULTIVITAMIN) TABS tablet Take 1 tablet by mouth daily at 12 noon.   12/07/2017 at Unknown time  . ranitidine (ZANTAC) 150 MG tablet Take 1 tablet (150 mg total) by mouth 2 (two) times daily. 60 tablet 1 12/07/2017 at Unknown time  . aspirin EC 81 MG tablet Take 1 tablet (81  mg total) by mouth daily. Take after 12 weeks for prevention of preeclampssia later in pregnancy (Patient not taking: Reported on 12/08/2017) 300 tablet 2 Not Taking at Unknown time  . Magnesium Oxide 400 (240 Mg) MG TABS Take 1 tablet (400 mg total) by mouth 2 (two) times daily. (Patient not taking: Reported on 12/08/2017) 60 tablet 5 Not Taking at Unknown time    No Known Allergies  Review of Systems: Negative except for what is mentioned in HPI.  Physical Exam:  Temp:  [97.7 F (36.5 C)-98.3 F (36.8 C)] 97.7 F (36.5 C) (02/27 0721) Pulse Rate:  [82-89] 82 (02/27 1003) Resp:  [18] 18 (02/27 0903) BP: (142-146)/(84-93) 143/84 (02/27 1003) Weight:  [280 lb (127 kg)] 280 lb (127 kg) (02/27 0603)  GENERAL: Well-developed, well-nourished female in no acute distress.   LUNGS: Clear to auscultation bilaterally.   HEART: Regular rate and rhythm.  ABDOMEN: Soft, nontender, nondistended,  gravid.  EXTREMITIES: Nontender, no edema, 2+ distal pulses.  Cervical Exam: Dilation: 4.5 Effacement (%): 60 Cervical Position: Posterior Station: -2 Exam by:: Syon Tews, cnm  FHT:  Baseline rate 130 bpm   Variability moderate  Accelerations present   Decelerations none  Contractions: Every two (2) to four (4) minutes, soft resting tone   Pertinent Labs/Studies:    Results for orders placed or performed during the hospital encounter of 12/08/17 (from the past 24 hour(s))  CBC     Status: Abnormal   Collection Time: 12/08/17  6:06 AM  Result Value Ref Range   WBC 9.2 3.6 - 11.0 K/uL   RBC 4.01 3.80 - 5.20 MIL/uL   Hemoglobin 11.4 (L) 12.0 - 16.0 g/dL   HCT 16.1 (L) 09.6 - 04.5 %   MCV 84.7 80.0 - 100.0 fL   MCH 28.5 26.0 - 34.0 pg   MCHC 33.7 32.0 - 36.0 g/dL   RDW 40.9 81.1 - 91.4 %   Platelets 168 150 - 440 K/uL  Comprehensive metabolic panel     Status: Abnormal   Collection Time: 12/08/17  6:06 AM  Result Value Ref Range   Sodium 136 135 - 145 mmol/L   Potassium 4.0 3.5 - 5.1 mmol/L   Chloride 109 101 - 111 mmol/L   CO2 18 (L) 22 - 32 mmol/L   Glucose, Bld 99 65 - 99 mg/dL   BUN 9 6 - 20 mg/dL   Creatinine, Ser 7.82 0.44 - 1.00 mg/dL   Calcium 8.6 (L) 8.9 - 10.3 mg/dL   Total Protein 6.5 6.5 - 8.1 g/dL   Albumin 2.8 (L) 3.5 - 5.0 g/dL   AST 34 15 - 41 U/L   ALT 15 14 - 54 U/L   Alkaline Phosphatase 120 38 - 126 U/L   Total Bilirubin 0.4 0.3 - 1.2 mg/dL   GFR calc non Af Amer >60 >60 mL/min   GFR calc Af Amer >60 >60 mL/min   Anion gap 9 5 - 15  Protein / creatinine ratio, urine     Status: None   Collection Time: 12/08/17  6:06 AM  Result Value Ref Range   Creatinine, Urine 133 mg/dL   Total Protein, Urine 17 mg/dL   Protein Creatinine Ratio 0.13 0.00 - 0.15 mg/mg[Cre]  Type and screen     Status: None   Collection Time: 12/08/17  6:06 AM  Result Value Ref Range   ABO/RH(D) A POS    Antibody Screen NEG    Sample Expiration      12/11/2017 Performed  at  [facility], [address] [place], [place] [address]   Urine Drug Screen, Qualitative [facility] only)     Status: None   Collection Time: 12/08/17  6:06 AM  Result Value Ref Range   Tricyclic, Ur Screen NONE DETECTED NONE DETECTED   Amphetamines, Ur Screen NONE DETECTED NONE DETECTED   MDMA (Ecstasy)Ur Screen NONE DETECTED NONE DETECTED   Cocaine Metabolite,Ur Mayo NONE DETECTED NONE DETECTED   Opiate, Ur Screen NONE DETECTED NONE DETECTED   Phencyclidine (PCP) Ur S NONE DETECTED NONE DETECTED   Cannabinoid 50 Ng, Ur Dana NONE DETECTED NONE DETECTED   Barbiturates, Ur Screen NONE DETECTED NONE DETECTED   Benzodiazepine, Ur Scrn NONE DETECTED NONE DETECTED   Methadone Scn, Ur NONE DETECTED NONE DETECTED    Assessment :  Judah Floyd is a 32 y.o. [id number] at [redacted]w[redacted]d being admitted for induction of labor due to BMI>40, Rh positive, GBS positive  FHR Category I  Plan:  Admit to birthing suites, see orders.   Induction/Augmentation as needed, per protocol.  Encouraged rest, position change, ambulation, and use of peanut ball.   Delivery plan: Hopeful for vaginal delivery.   Dr. Valentino Saxon notified of admission and plan or care.    Gunnar Bulla, CNM [facility], [facility]

## 2017-12-08 NOTE — Progress Notes (Signed)
Patient ID: Laurie Floyd, female   DOB: 1985-11-11, 32 y.o.   MRN: [id number]  Laurie Floyd is a 32 y.o. [id number] at 1576w5d by LMP admitted for induction of labor due to BMI>40.  Subjective:  Sitting in bed, breathing with contractions. Reports pelvic pressure with the urge to push. Endorses increased frequency, duration, and intensity. Family members at bedside.   Denies difficulty breathing or respiratory distress, chest pain, vaginal bleeding, dysuria, and leg pain or swelling.   Objective:  Temp:  [97.7 F (36.5 C)-98.3 F (36.8 C)] 98.2 F (36.8 C) (02/27 1136) Pulse Rate:  [75-89] 75 (02/27 1136) Resp:  [18] 18 (02/27 1136) BP: (142-154)/(84-97) 154/97 (02/27 1136) Weight:  [280 lb (127 kg)] 280 lb (127 kg) (02/27 0603)  Fetal Wellbeing:  Category I  UC:   regular, every five (5) to six (6) minutes, soft resting tone  SVE:   Dilation: 7 Effacement (%): 60 Station: -2 Exam by:: Jonalyn Sedlak, M CNM  Labs: Lab Results  Component Value Date   WBC 9.2 12/08/2017   HGB 11.4 (L) 12/08/2017   HCT 33.9 (L) 12/08/2017   MCV 84.7 12/08/2017   PLT 168 12/08/2017    Assessment:  Laurie Floyd is a 32 y.o. [id number] at 6876w5d admitted for induction of labor due to BMI>40, Rh positive, GBS positive  FHR Category I  Plan:  Encouraged rest and position change.   Continue orders as written. Reassess as needed.   Room prepared for second stage.    Gunnar BullaJenkins Michelle Drew Herman, CNM [facility], [facility] 12/08/2017, 1:06 PM

## 2017-12-09 DIAGNOSIS — D649 Anemia, unspecified: Secondary | ICD-10-CM

## 2017-12-09 DIAGNOSIS — Z3A40 40 weeks gestation of pregnancy: Secondary | ICD-10-CM

## 2017-12-09 DIAGNOSIS — O9081 Anemia of the puerperium: Secondary | ICD-10-CM

## 2017-12-09 LAB — COMPREHENSIVE METABOLIC PANEL
ALBUMIN: 2.3 g/dL — AB (ref 3.5–5.0)
ALK PHOS: 90 U/L (ref 38–126)
ALT: 15 U/L (ref 14–54)
AST: 34 U/L (ref 15–41)
Anion gap: 7 (ref 5–15)
BILIRUBIN TOTAL: 0.5 mg/dL (ref 0.3–1.2)
BUN: 9 mg/dL (ref 6–20)
CALCIUM: 8.2 mg/dL — AB (ref 8.9–10.3)
CO2: 20 mmol/L — ABNORMAL LOW (ref 22–32)
Chloride: 110 mmol/L (ref 101–111)
Creatinine, Ser: 0.75 mg/dL (ref 0.44–1.00)
GFR calc Af Amer: >60 mL/min (ref >60)
GFR calc non Af Amer: >60 mL/min (ref >60)
Glucose, Bld: 90 mg/dL (ref 65–99)
POTASSIUM: 4.2 mmol/L (ref 3.5–5.1)
Sodium: 137 mmol/L (ref 135–145)
TOTAL PROTEIN: 5.4 g/dL — AB (ref 6.5–8.1)

## 2017-12-09 LAB — CBC
HEMATOCRIT: 31 % — AB (ref 35.0–47.0)
Hemoglobin: 10.2 g/dL — ABNORMAL LOW (ref 12.0–16.0)
MCH: 27.8 pg (ref 26.0–34.0)
MCHC: 32.9 g/dL (ref 32.0–36.0)
MCV: 84.4 fL (ref 80.0–100.0)
Platelets: 143 10*3/uL — ABNORMAL LOW (ref 150–440)
RBC: 3.67 MIL/uL — ABNORMAL LOW (ref 3.80–5.20)
RDW: 14.2 % (ref 11.5–14.5)
WBC: 13.4 10*3/uL — AB (ref 3.6–11.0)

## 2017-12-09 LAB — RPR: RPR Ser Ql: NONREACTIVE

## 2017-12-09 MED ORDER — FERROUS SULFATE 325 (65 FE) MG PO TABS
325.0000 mg | ORAL_TABLET | Freq: Three times a day (TID) | ORAL | Status: DC
  Administered 2017-12-09 – 2017-12-10 (×5): 325 mg via ORAL
  Filled 2017-12-09 (×5): qty 1

## 2017-12-09 MED ORDER — DOCUSATE SODIUM 100 MG PO CAPS: 100.0000 mg | ORAL_CAPSULE | Freq: Two times a day (BID) | ORAL | Status: DC | PRN

## 2017-12-09 NOTE — Progress Notes (Signed)
Patient ID: Laurie SchwalbeZoe Floyd, female   DOB: 09-27-1986, 32 y.o.   MRN: [id number]  Post Partum Day # 1, s/p SVD  Subjective:  Doing well, desires discharge home today.   Denies difficulty breathing or respiratory distress, chest pain, abdominal pain, excessive vaginal bleeding, dysuria, and leg pain or swelling.   Objective:  Temp:  [98 F (36.7 C)-100.4 F (38 C)] 98.1 F (36.7 C) (02/28 0836) Pulse Rate:  [74-108] 76 (02/28 0836) Resp:  [18-20] 18 (02/28 0836) BP: (116-157)/(58-104) 157/104 (02/28 0836) SpO2:  [97 %-99 %] 99 % (02/28 0836)  Physical Exam:   General: alert and cooperative   Lungs: clear to auscultation bilaterally  Breasts: normal appearance, no masses or tenderness  Heart: regular rate and rhythm, S1, S2 normal, no murmur, click, rub or gallop  Abdomen: soft, non-tender; bowel sounds normal; no masses,  no organomegaly  Pelvis: Lochia: appropriate, Uterine  Fundus: firm  Extremities: DVT Evaluation: No evidence of DVT seen on physical exam.  Recent Labs    12/08/17 0606 12/09/17 0639  HGB 11.4* 10.2*  HCT 33.9* 31.0*    Assessment/Plan: Discharge home and Breastfeeding   LOS: 1 day    Gunnar BullaJenkins Michelle Tymir Terral, CNM [facility] 12/09/2017 8:54 AM

## 2017-12-09 NOTE — Clinical Social Work Maternal (Signed)
CLINICAL SOCIAL WORK MATERNAL/CHILD NOTE  Patient Details  Name: Laurie Floyd MRN: [id number] Date of Birth: 23-Jun-1986  Date:  12/09/2017  Clinical Social Worker Initiating Note:  ([place], [place] [phone] ) Date/Time: Initiated:  12/09/17/1710     Child's Name:  Laurie Floyd )   Biological Parents:  Mother, Father   Need for Interpreter:  None   Reason for Referral:  Current Substance Use/Substance Use During Pregnancy    Address:  [address] [place] [address]    Phone number:  [phone] (home)     Additional phone number:  Household Members/Support Persons (HM/SP):   Household Member/Support Person 1   HM/SP Name Relationship DOB or Age  HM/SP -1 Laurie Floyd ) (Husband )    HM/SP -2        HM/SP -3        HM/SP -4        HM/SP -5        HM/SP -6        HM/SP -7        HM/SP -8          Natural Supports (not living in the home):  Parent   Professional Supports:     Employment: Animator   Type of Work:     Education:  Nurse, adult   Homebound arranged:    Museum/gallery curator Resources:  Multimedia programmer   Other Resources:      Cultural/Religious Considerations Which May Impact Care:   Strengths:  Ability to meet basic needs    Psychotropic Medications:         Pediatrician:       Pediatrician List:   [place]      Pediatrician Fax Number:    Risk Factors/Current Problems:  Substance Use    Cognitive State:      Mood/Affect:  Happy , Calm , Comfortable    CSW Assessment: Clinical Education officer, museum (CSW) received consult for "Mom tested positive for MJ in first trimester of pregnancy. Patient has been negative throughout pregnancy. Mother UDS clean at admission." Per nurse mother's UDS is negative and infant's UDS is negative. Cord tissue tox screen is pending. CSW met with mother and her mother was at bedside. Patient  felt comfortable with her mother staying in the room during assessment. Per patient she moved to [place], [place] from [place] and this is her 4th baby. Per patient she lives with her husband Laurie Floyd in [place] with their 3 other children. Mother reported that she stopped using marijuana once she found out she was pregnant. Mother denied alcohol and other drug use. Mother reported that she has all the supplies needed for infant and both her and her husband work. Patient reported that she has a strong family support and her mother comes to visit often from [place]. CSW provided patient with a list of [place] substance abuse resources. CSW made patient aware that if cord tissue tox screen comes back positive then a child protective services report will be made. No other needs at this time. Please reconsult if future social work needs arise. CSW signing off.   CSW Plan/Description:  No Further Intervention Required/No Barriers to Discharge, Other Information/Referral to [employer], CSW Will Continue to Monitor Umbilical Cord Tissue Drug Screen Results and Make Report if Laurie Floyd, Laurie Floyd 12/09/2017, 5:12  PM

## 2017-12-10 MED ORDER — IBUPROFEN 600 MG PO TABS: 600.0000 mg | ORAL_TABLET | Freq: Four times a day (QID) | ORAL | 0 refills | Status: DC

## 2017-12-10 MED ORDER — FERROUS SULFATE 325 (65 FE) MG PO TABS: 325.0000 mg | ORAL_TABLET | Freq: Three times a day (TID) | ORAL | 3 refills | Status: DC

## 2017-12-10 MED ORDER — NORETHINDRONE 0.35 MG PO TABS: 1.0000 | ORAL_TABLET | Freq: Every day | ORAL | 11 refills | Status: DC

## 2017-12-10 NOTE — Lactation Note (Signed)
This note was copied from a baby's chart. Lactation Consultation Note  Patient Name: Boy Clement SayresZoe Misetich ZOXWR'UToday's Date: 12/10/2017 Reason for consult: Follow-up assessment;Mother's request   Maternal Data Formula Feeding for Exclusion: No Does the patient have breastfeeding experience prior to this delivery?: Yes Mom concerned about milk production in left breast, left breast is smaller than right, produced less milk with last child 6 yrs ago, wants to know if pumping that breast would help, encouraged her to continue nursing on left to stimulate production and that this baby may be more efficient at removing milk from the breast or it may be that this breast has  less glandular tissue and won't produce more.  She may pump this breast after some feedings to increase stimulation but advised her not to stress her self with pumping every feeding, also offered her a lactation consult in the future if needed for breastfeeding concerns or problems   Feeding Feeding Type: Breast Fed Length of feed: 55 min  LATCH Score Latch: Grasps breast easily, tongue down, lips flanged, rhythmical sucking.  Audible Swallowing: Spontaneous and intermittent  Type of Nipple: Everted at rest and after stimulation  Comfort (Breast/Nipple): Filling, red/small blisters or bruises, mild/mod discomfort  Hold (Positioning): No assistance needed to correctly position infant at breast.  LATCH Score: 9  Interventions Interventions: Coconut oil  Lactation Tools Discussed/Used WIC Program: No   Consult Status Consult Status: Complete    Dyann KiefMarsha D Terrelle Ruffolo 12/10/2017, 2:12 PM

## 2017-12-10 NOTE — Discharge Summary (Signed)
Obstetric Discharge Summary  Patient ID: Laurie Floyd MRN: [id number] DOB/AGE: 01-20-1986 32 y.o.   Date of Admission: 12/08/2017 Laurie Floyd, CNM Horton Marshall(A. Cherry, MD)  Date of Discharge: 12/10/2017 Laurie Floyd, CNM Charlena Cross(D. Evans, MD)  Admitting Diagnosis: Induction of labor at 538w5d  Secondary Diagnosis: Obestity in pregnancy, Postpartum anemia  Mode of Delivery: normal spontaneous vaginal delivery  Discharge Diagnosis: No other diagnosis   Intrapartum Procedures: Atificial rupture of membranes   Post partum procedures: None  Complications: none   Brief Hospital Course   Laurie Floyd is a [id number] who had a SVD on 12/08/2017;  for further details of this birth, please refer to the delivey note.  Patient had an uncomplicated postpartum course.  By time of discharge on PPD#2, her pain was controlled on oral pain medications; she had appropriate lochia and was ambulating, voiding without difficulty and tolerating regular diet.  She was deemed stable for discharge to home.    Labs:  CBC Latest Ref Rng & Units 12/09/2017 12/08/2017 09/10/2017  WBC 3.6 - 11.0 K/uL 13.4(H) 9.2 11.6(H)  Hemoglobin 12.0 - 16.0 g/dL 10.2(L) 11.4(L) 11.9  Hematocrit 35.0 - 47.0 % 31.0(L) 33.9(L) 35.4  Platelets 150 - 440 K/uL 143(L) 168 221   A POS  Physical exam:   Temp:  [98 F (36.7 C)-98.6 F (37 C)] 98 F (36.7 C) (03/01 1116) Pulse Rate:  [68-95] 68 (03/01 1116) Resp:  [16-20] 18 (03/01 1116) BP: (125-142)/(62-90) 131/62 (03/01 1116) SpO2:  [97 %-99 %] 99 % (03/01 0759)  General: alert and no distress  Lochia: appropriate  Abdomen: soft, NT  Uterine Fundus: firm  Extremities: No evidence of DVT seen on physical exam. No lower extremity edema.  Discharge Instructions: Per After Visit Summary.  Activity: Advance as tolerated. Pelvic rest for 6 weeks.  Also refer to After Visit Summary  Diet: Regular  Medications:  Allergies as of 12/10/2017   No Known Allergies      Medication List    STOP taking these medications   aspirin EC 81 MG tablet   Magnesium Oxide 400 (240 Mg) MG Tabs   prenatal multivitamin Tabs tablet   ranitidine 150 MG tablet Commonly known as:  ZANTAC     TAKE these medications   acetaminophen 500 MG tablet Commonly known as:  TYLENOL Take 500 mg by mouth every 6 (six) hours as needed.   ferrous sulfate 325 (65 FE) MG tablet Take 1 tablet (325 mg total) by mouth 3 (three) times daily with meals.   ibuprofen 600 MG tablet Commonly known as:  ADVIL,MOTRIN Take 1 tablet (600 mg total) by mouth every 6 (six) hours.   norethindrone 0.35 MG tablet Commonly known as:  MICRONOR,CAMILA,ERRIN Take 1 tablet (0.35 mg total) by mouth daily. Start January 09, 2018      Outpatient follow up:  Follow-up Information    Laurie Floyd, Laurie Floyd, CNM. Schedule an appointment as soon as possible for a visit in 6 week(s).   Specialties:  Certified Nurse Midwife, Obstetrics and Gynecology, Radiology Why:  please call and schedule a postpartum follow up appointment for 6 weeks Contact information: [address] [place] [address] [phone]          Postpartum contraception: oral progesterone-only contraceptive  Discharged Condition: stable  Discharged to: home   Newborn Data: Disposition:home with mother  Apgars: APGAR (1 MIN): 8   APGAR (5 MINS): 9    Baby Feeding: Breast   Laurie Floyd, CNM [facility]

## 2017-12-10 NOTE — Progress Notes (Signed)
Patient discharged home with infant. Discharge instructions, prescriptions and follow up appointment given to and reviewed with patient. Patient verbalized understanding. Patient wheeled out with infant by auxiliary.  

## 2018-01-04 ENCOUNTER — Other Ambulatory Visit: Payer: Self-pay

## 2018-01-07 ENCOUNTER — Other Ambulatory Visit: Payer: Self-pay | Admitting: Certified Nurse Midwife

## 2018-01-17 ENCOUNTER — Encounter: Payer: Self-pay | Admitting: Certified Nurse Midwife

## 2018-01-17 ENCOUNTER — Ambulatory Visit (INDEPENDENT_AMBULATORY_CARE_PROVIDER_SITE_OTHER): Payer: 59 | Admitting: Certified Nurse Midwife

## 2018-01-17 NOTE — Patient Instructions (Signed)
Preventive Care 18-39 Years, Female Preventive care refers to lifestyle choices and visits with your health care provider that can promote health and wellness. What does preventive care include?  A yearly physical exam. This is also called an annual well check.  Dental exams once or twice a year.  Routine eye exams. Ask your health care provider how often you should have your eyes checked.  Personal lifestyle choices, including: ? Daily care of your teeth and gums. ? Regular physical activity. ? Eating a healthy diet. ? Avoiding tobacco and drug use. ? Limiting alcohol use. ? Practicing safe sex. ? Taking vitamin and mineral supplements as recommended by your health care provider. What happens during an annual well check? The services and screenings done by your health care provider during your annual well check will depend on your age, overall health, lifestyle risk factors, and family history of disease. Counseling Your health care provider may ask you questions about your:  Alcohol use.  Tobacco use.  Drug use.  Emotional well-being.  Home and relationship well-being.  Sexual activity.  Eating habits.  Work and work Statistician.  Method of birth control.  Menstrual cycle.  Pregnancy history.  Screening You may have the following tests or measurements:  Height, weight, and BMI.  Diabetes screening. This is done by checking your blood sugar (glucose) after you have not eaten for a while (fasting).  Blood pressure.  Lipid and cholesterol levels. These may be checked every 5 years starting at age 66.  Skin check.  Hepatitis C blood test.  Hepatitis B blood test.  Sexually transmitted disease (STD) testing.  BRCA-related cancer screening. This may be done if you have a family history of breast, ovarian, tubal, or peritoneal cancers.  Pelvic exam and Pap test. This may be done every 3 years starting at age 40. Starting at age 59, this may be done every 5  years if you have a Pap test in combination with an HPV test.  Discuss your test results, treatment options, and if necessary, the need for more tests with your health care provider. Vaccines Your health care provider may recommend certain vaccines, such as:  Influenza vaccine. This is recommended every year.  Tetanus, diphtheria, and acellular pertussis (Tdap, Td) vaccine. You may need a Td booster every 10 years.  Varicella vaccine. You may need this if you have not been vaccinated.  HPV vaccine. If you are 69 or younger, you may need three doses over 6 months.  Measles, mumps, and rubella (MMR) vaccine. You may need at least one dose of MMR. You may also need a second dose.  Pneumococcal 13-valent conjugate (PCV13) vaccine. You may need this if you have certain conditions and were not previously vaccinated.  Pneumococcal polysaccharide (PPSV23) vaccine. You may need one or two doses if you smoke cigarettes or if you have certain conditions.  Meningococcal vaccine. One dose is recommended if you are age 27-21 years and a first-year college student living in a residence hall, or if you have one of several medical conditions. You may also need additional booster doses.  Hepatitis A vaccine. You may need this if you have certain conditions or if you travel or work in places where you may be exposed to hepatitis A.  Hepatitis B vaccine. You may need this if you have certain conditions or if you travel or work in places where you may be exposed to hepatitis B.  Haemophilus influenzae type b (Hib) vaccine. You may need this if  you have certain risk factors.  Talk to your health care provider about which screenings and vaccines you need and how often you need them. This information is not intended to replace advice given to you by your health care provider. Make sure you discuss any questions you have with your health care provider. Document Released: 11/24/2001 Document Revised: 06/17/2016  Document Reviewed: 07/30/2015 Elsevier Interactive Patient Education  [employer].

## 2018-01-17 NOTE — Progress Notes (Signed)
Subjective:    Laurie Floyd is a 32 y.o. [id number] Caucasian female who presents for a postpartum visit. She is 6 weeks postpartum following a spontaneous vaginal delivery at 40+5 gestational weeks. Anesthesia: local. I have fully reviewed the prenatal and intrapartum course.   Postpartum course has been uncomplicated. Baby's course has been uncomplicated. Baby is feeding by breast. Bleeding no bleeding. Bowel function is normal. Bladder function is normal.   Patient is not sexually active. Contraception method is oral progesterone-only contraceptive. Postpartum depression screening: negative. Score 3.  Last pap 11/2016 and was Negative/Negative.  Denies difficulty breathing or respiratory distress, chest pain, abdominal pain, excessive vaginal bleeding, and leg pain or swelling.   The following portions of the patient's history were reviewed and updated as appropriate: allergies, current medications, past medical history, past surgical history and problem list.  Review of Systems  Pertinent items are noted in HPI.   Objective:   BP 116/87   Pulse 85   Ht 5\' 7"  (1.702 m)   Wt 246 lb 4.8 oz (111.7 kg)   Breastfeeding? Yes   BMI 38.58 kg/m   General:  alert, cooperative and no distress   Breasts:  deferred, no complaints  Lungs: clear to auscultation bilaterally  Heart:  regular rate and rhythm  Abdomen: soft, nontender   Vulva: normal  Vagina: normal vagina  Cervix:  closed  Corpus: Well-involuted  Adnexa:  Non-palpable    Depression screen [facility] 2/9 01/17/2018  Decreased Interest 0  Down, Depressed, Hopeless 0  PHQ - 2 Score 0  Altered sleeping 1  Tired, decreased energy 1  Change in appetite 1  Feeling bad or failure about yourself  0  Trouble concentrating 0  Moving slowly or fidgety/restless 0  Suicidal thoughts 0  PHQ-9 Score 3       Assessment:   Postpartum exam Six (6) wks s/p vaginal birth Breastfeeding Depression screening Contraception counseling   Plan:    May return to work without restriction. Paperwork completed and note given.   Reviewed red flag symptoms and when to call.   Follow up in: 10 months for Annual Exam or earlier if needed.    Gunnar BullaJenkins Michelle Emonee Winkowski, CNM [facility], [facility]

## 2019-07-12 ENCOUNTER — Telehealth: Payer: Self-pay | Admitting: Certified Nurse Midwife

## 2019-07-12 NOTE — Telephone Encounter (Signed)
The patient called and stated that se needs to make her 1st prenatal appointment. Pt stated she is 34 weeks. I informed the patient that our office cut off date for scheduling is 32 wks. I informed pt a message will be sent to a nurse/ Glass blower/designer to verify. Please advise.

## 2019-07-17 ENCOUNTER — Ambulatory Visit (INDEPENDENT_AMBULATORY_CARE_PROVIDER_SITE_OTHER): Payer: 59 | Admitting: Certified Nurse Midwife

## 2019-07-17 ENCOUNTER — Other Ambulatory Visit: Payer: 59

## 2019-07-17 ENCOUNTER — Encounter: Payer: Self-pay | Admitting: Certified Nurse Midwife

## 2019-07-17 ENCOUNTER — Other Ambulatory Visit: Payer: Self-pay

## 2019-07-17 VITALS — BP 109/75 | HR 86 | Ht 67.0 in | Wt 275.0 lb

## 2019-07-17 DIAGNOSIS — Z3493 Encounter for supervision of normal pregnancy, unspecified, third trimester: Secondary | ICD-10-CM

## 2019-07-17 DIAGNOSIS — Z0289 Encounter for other administrative examinations: Secondary | ICD-10-CM

## 2019-07-17 NOTE — Patient Instructions (Signed)

## 2019-07-17 NOTE — Progress Notes (Signed)
NEW OB HISTORY AND PHYSICAL  SUBJECTIVE:       Laurie Floyd is a 33 y.o. [id number] female, No LMP recorded., Estimated Date of Delivery: None noted., Unknown, presents today for establishment of Prenatal Care. She has no unusual complaints .  Body mass index is 43.07 kg/m.    Gynecologic History No LMP recorded. Normal Contraception: none Last Pap: 11/25/16 Results were: normal  Obstetric History OB History  Gravida Para Term Preterm AB Living  5 4 4   1 4   SAB TAB Ectopic Multiple Live Births  1     0 4    # Outcome Date GA Lbr Len/2nd Weight Sex Delivery Anes PTL Lv  5 Term 12/08/17 [redacted]w[redacted]d  9 lb 8 oz (4.309 kg) M Vag-Spont None  LIV  4 Term 12/11/11 [redacted]w[redacted]d  5 lb 2.1 oz (2.327 kg) M Vag-Spont  N LIV  3 Term 11/26/09 [redacted]w[redacted]d  6 lb (2.722 kg) F Vag-Spont  N LIV  2 Term 01/17/08 [redacted]w[redacted]d  7 lb (3.175 kg) M Vag-Spont   LIV  1 SAB 2006        ND    Past Medical History:  Diagnosis Date  . Anxiety     Past Surgical History:  Procedure Laterality Date  . NO PAST SURGERIES      Current Outpatient Medications on File Prior to Visit  Medication Sig Dispense Refill  . acetaminophen (TYLENOL) 500 MG tablet Take 500 mg by mouth every 6 (six) hours as needed.    2007 amoxicillin (AMOXIL) 500 MG capsule Take 500 mg by mouth 3 (three) times daily.    . ferrous sulfate 325 (65 FE) MG tablet Take 1 tablet (325 mg total) by mouth 3 (three) times daily with meals. 90 tablet 3  . ibuprofen (ADVIL,MOTRIN) 600 MG tablet Take 1 tablet (600 mg total) by mouth every 6 (six) hours. 30 tablet 0  . norethindrone (MICRONOR,CAMILA,ERRIN) 0.35 MG tablet Take 1 tablet (0.35 mg total) by mouth daily. Start January 09, 2018 1 Package 11   No current facility-administered medications on file prior to visit.     No Known Allergies  Social History   Socioeconomic History  . Marital status: Unknown    Spouse name: Not on file  . Number of children: 3  . Years of education: Not on file  . Highest education  level: Not on file  Occupational History  . Not on file  Social Needs  . Financial resource strain: Not on file  . Food insecurity    Worry: Not on file    Inability: Not on file  . Transportation needs    Medical: Not on file    Non-medical: Not on file  Tobacco Use  . Smoking status: Former Smoker    Types: Cigarettes    Start date: 02/19/2017  . Smokeless tobacco: Never Used  Substance and Sexual Activity  . Alcohol use: No    Alcohol/week: 2.0 standard drinks    Types: 2 Standard drinks or equivalent per week  . Drug use: No  . Sexual activity: Yes    Birth control/protection: Pill  Lifestyle  . Physical activity    Days per week: Not on file    Minutes per session: Not on file  . Stress: Not on file  Relationships  . Social 04/21/2017 on phone: Not on file    Gets together: Not on file    Attends religious service: Not on file  Active member of club or organization: Not on file    Attends meetings of clubs or organizations: Not on file    Relationship status: Not on file  . Intimate partner violence    Fear of current or ex partner: Not on file    Emotionally abused: Not on file    Physically abused: Not on file    Forced sexual activity: Not on file  Other Topics Concern  . Not on file  Social History Narrative  . Not on file    Family History  Problem Relation Age of Onset  . Hypertension Mother   . Hypertension Father   . Diabetes Father     The following portions of the patient's history were reviewed and updated as appropriate: allergies, current medications, past OB history, past medical history, past surgical history, past family history, past social history, and problem list.    OBJECTIVE: Initial Physical Exam (New OB)  GENERAL APPEARANCE: alert, well appearing, in no apparent distress, oriented to person, place and time, overweight HEAD: normocephalic, atraumatic MOUTH: mucous membranes moist, pharynx normal without  lesions THYROID: no thyromegaly or masses present BREASTS: no masses noted, no significant tenderness, no palpable axillary nodes, no skin changes LUNGS: clear to auscultation, no wheezes, rales or rhonchi, symmetric air entry HEART: regular rate and rhythm, no murmurs ABDOMEN: soft, nontender, nondistended, no abnormal masses, no epigastric pain, obese and fundus soft, nontender 35 cm EXTREMITIES: no redness or tenderness in the calves or thighs, no limitation in range of motion, intact peripheral pulses SKIN: normal coloration and turgor, no rashes LYMPH NODES: no adenopathy palpable NEUROLOGIC: alert, oriented, normal speech, no focal findings or movement disorder noted  PELVIC EXAM deferred  ASSESSMENT: Normal pregnancy  PLAN: New OB counseling: The patient has been given an overview regarding routine prenatal care. Recommendations regarding diet, weight gain, and exercise in pregnancy were given. Prenatal testing, optional genetic testing,carrier screening test and ultrasound use in pregnancy were reviewed.  She declines genetic testing. PT state she delayed entry to [facility] because she is considering home birth. Discussed that we do not do home birth at [facility]. She verbalizes understanding. She states that with covid and there restrictions on family and her desire to have natural birth that she has really struggled with idea of home birth. Reassured her that we would do our best to have a natural birth in the hospital setting. Benefits of Breast Feeding were discussed. The patient is encouraged to consider nursing her baby post partum. Follow up 1 wk for u/s anatomy/dating.    Philip Aspen, CNM

## 2019-07-18 LAB — URINALYSIS, ROUTINE W REFLEX MICROSCOPIC
Bilirubin, UA: NEGATIVE
Glucose, UA: NEGATIVE
Ketones, UA: NEGATIVE
Nitrite, UA: NEGATIVE
Protein,UA: NEGATIVE
RBC, UA: NEGATIVE
Specific Gravity, UA: 1.012 (ref 1.005–1.030)
Urobilinogen, Ur: 0.2 mg/dL (ref 0.2–1.0)
pH, UA: 7 (ref 5.0–7.5)

## 2019-07-18 LAB — ABO AND RH: Rh Factor: POSITIVE

## 2019-07-18 LAB — CBC
Hematocrit: 35.7 % (ref 34.0–46.6)
Hemoglobin: 11.9 g/dL (ref 11.1–15.9)
MCH: 28.2 pg (ref 26.6–33.0)
MCHC: 33.3 g/dL (ref 31.5–35.7)
MCV: 85 fL (ref 79–97)
Platelets: 187 10*3/uL (ref 150–450)
RBC: 4.22 x10E6/uL (ref 3.77–5.28)
RDW: 13.2 % (ref 11.7–15.4)
WBC: 9.1 10*3/uL (ref 3.4–10.8)

## 2019-07-18 LAB — ANTIBODY SCREEN: Antibody Screen: NEGATIVE

## 2019-07-18 LAB — MICROSCOPIC EXAMINATION: Casts: NONE SEEN /lpf

## 2019-07-18 LAB — RPR: RPR Ser Ql: NONREACTIVE

## 2019-07-18 LAB — HEPATITIS B SURFACE ANTIGEN: Hepatitis B Surface Ag: NEGATIVE

## 2019-07-18 LAB — GC/CHLAMYDIA PROBE AMP
Chlamydia trachomatis, NAA: NEGATIVE
Neisseria Gonorrhoeae by PCR: NEGATIVE

## 2019-07-18 LAB — RUBELLA SCREEN: Rubella Antibodies, IGG: 3.73 index (ref >0.99)

## 2019-07-18 LAB — VARICELLA ZOSTER ANTIBODY, IGG: Varicella zoster IgG: 1308 index (ref >165)

## 2019-07-18 LAB — GLUCOSE, 1 HOUR GESTATIONAL: Gestational Diabetes Screen: 129 mg/dL (ref 65–139)

## 2019-07-18 LAB — TSH: TSH: 2.12 u[IU]/mL (ref 0.450–4.500)

## 2019-07-19 LAB — URINE CULTURE

## 2019-07-26 ENCOUNTER — Other Ambulatory Visit: Payer: Self-pay

## 2019-07-26 ENCOUNTER — Ambulatory Visit (INDEPENDENT_AMBULATORY_CARE_PROVIDER_SITE_OTHER): Payer: 59

## 2019-07-26 ENCOUNTER — Ambulatory Visit (INDEPENDENT_AMBULATORY_CARE_PROVIDER_SITE_OTHER): Payer: 59 | Admitting: Certified Nurse Midwife

## 2019-07-26 VITALS — BP 106/71 | HR 83 | Wt 277.4 lb

## 2019-07-26 DIAGNOSIS — Z3493 Encounter for supervision of normal pregnancy, unspecified, third trimester: Secondary | ICD-10-CM

## 2019-07-26 DIAGNOSIS — Z363 Encounter for antenatal screening for malformations: Secondary | ICD-10-CM | POA: Diagnosis not present

## 2019-07-26 LAB — POCT URINALYSIS DIPSTICK OB
Bilirubin, UA: NEGATIVE
Blood, UA: NEGATIVE
Glucose, UA: NEGATIVE
Ketones, UA: NEGATIVE
Leukocytes, UA: NEGATIVE
Nitrite, UA: NEGATIVE
POC,PROTEIN,UA: NEGATIVE
Spec Grav, UA: 1.01 (ref 1.010–1.025)
Urobilinogen, UA: 0.2 E.U./dL
pH, UA: 6 (ref 5.0–8.0)

## 2019-07-26 NOTE — Progress Notes (Signed)
ROB doing well. Feels good movement. Anatomy u/s today ( see below), complete. Results reviewed. GBS testing today. Pt declines SVE. Follow up 1 wk.   Philip Aspen, CNM   Patient Name: Laurie Floyd DOB: Aug 07, 1986 MRN: [id number] ULTRASOUND REPORT  Location: [facility] Date of Service: 07/26/2019   Indications:Anatomy Ultrasound Findings:  Laurie Floyd intrauterine pregnancy is visualized with FHR at 146 BPM. Biometrics give an (U/S) Gestational age of [redacted]w[redacted]d and an (U/S) EDD of 08/20/2019; this correlates with the clinically established Estimated Date of Delivery: 08/19/19  Fetal presentation is Cephalic.  EFW: 2841 g ( 6 lbs 4 oz ).  Placenta: anterior. Grade: 1 AFI: subjectively normal.  Anatomic survey is complete and normal; Gender - surprise.    Right Ovary is normal in appearance. Left Ovary is normal appearance. Survey of the adnexa demonstrates no adnexal masses. There is no free peritoneal fluid in the cul de sac.  Impression: 1. [redacted]w[redacted]d Viable Singleton Intrauterine pregnancy by U/S. 2. (U/S) EDD is consistent with Clinically established Estimated Date of Delivery: 08/19/19 . 3. Normal Anatomy Scan  Recommendations: 1.Clinical correlation with the patient's History and Physical Exam.   Jenine M. Albertine Grates    RDMS

## 2019-07-26 NOTE — Patient Instructions (Addendum)
Group B Streptococcus Infection During Pregnancy  Group B Streptococcus (GBS) is a type of bacteria (Streptococcus agalactiae) that is often found in healthy people, commonly in the rectum, vagina, and intestines. In people who are healthy and not pregnant, the bacteria rarely cause serious illness or complications. However, women who test positive for GBS during pregnancy can pass the bacteria to their baby during childbirth, which can cause serious infection in the baby after birth. Women with GBS may also have infections during their pregnancy or immediately after childbirth, such as urinary tract infections (UTIs) or infections of the uterus (uterine infections). Having GBS also increases a woman's risk of complications during pregnancy, such as early (preterm) labor or delivery, miscarriage, or stillbirth. Routine testing (screening) for GBS is recommended for all pregnant women. What increases the risk? You may have a higher risk for GBS infection during pregnancy if you had one during a past pregnancy. What are the signs or symptoms? In most cases, GBS infection does not cause symptoms in pregnant women. Signs and symptoms of a possible GBS-related infection may include:  Labor starting before the 37th week of pregnancy.  A UTI or bladder infection, which may cause: ? Fever. ? Pain or burning during urination. ? Frequent urination.  Fever during labor, along with: ? Bad-smelling discharge. ? Uterine tenderness. ? Rapid heartbeat in the mother, baby, or both. Rare but serious symptoms of a possible GBS-related infection in women include:  Blood infection (septicemia). This may cause fever, chills, or confusion.  Lung infection (pneumonia). This may cause fever, chills, cough, rapid breathing, difficulty breathing, or chest pain.  Bone, joint, skin, or soft tissue infection. How is this diagnosed? You may be screened for GBS between week 35 and week 37 of your pregnancy. If you have  symptoms of preterm labor, you may be screened earlier. This condition is diagnosed based on lab test results from:  A swab of fluid from the vagina and rectum.  A urine sample. How is this treated? This condition is treated with antibiotic medicine. When you go into labor, or as soon as your water breaks (your membranes rupture), you will be given antibiotics through an IV tube. Antibiotics will continue until after you give birth. If you are having a cesarean delivery, you do not need antibiotics unless your membranes have already ruptured. Follow these instructions at home:  Take over-the-counter and prescription medicines only as told by your health care provider.  Take your antibiotic medicine as told by your health care provider. Do not stop taking the antibiotic even if you start to feel better.  Keep all pre-birth (prenatal) visits and follow-up visits as told by your health care provider. This is important. Contact a health care provider if:  You have pain or burning when you urinate.  You have to urinate frequently.  You have a fever or chills.  You develop a bad-smelling vaginal discharge. Get help right away if:  Your membranes rupture.  You go into labor.  You have severe pain in your abdomen.  You have difficulty breathing.  You have chest pain. This information is not intended to replace advice given to you by your health care provider. Make sure you discuss any questions you have with your health care provider. Document Released: 01/05/2008 Document Revised: 01/19/2019 Document Reviewed: 07 Braxton Hicks Contractions Contractions of the uterus can occur throughout pregnancy, but they are not always a sign that you are in labor. You may have practice contractions called [place]  contractions. These false labor contractions are sometimes confused with true labor. What are Deberah Pelton contractions? Braxton Hicks contractions are tightening movements that  occur in the muscles of the uterus before labor. Unlike true labor contractions, these contractions do not result in opening (dilation) and thinning of the cervix. Toward the end of pregnancy (32-34 weeks), Braxton Hicks contractions can happen more often and may become stronger. These contractions are sometimes difficult to tell apart from true labor because they can be very uncomfortable. You should not feel embarrassed if you go to the hospital with false labor. Sometimes, the only way to tell if you are in true labor is for your health care provider to look for changes in the cervix. The health care provider will do a physical exam and may monitor your contractions. If you are not in true labor, the exam should show that your cervix is not dilating and your water has not broken. If there are no other health problems associated with your pregnancy, it is completely safe for you to be sent home with false labor. You may continue to have Braxton Hicks contractions until you go into true labor. How to tell the difference between true labor and false labor True labor  Contractions last 30-70 seconds.  Contractions become very regular.  Discomfort is usually felt in the top of the uterus, and it spreads to the lower abdomen and low back.  Contractions do not go away with walking.  Contractions usually become more intense and increase in frequency.  The cervix dilates and gets thinner. False labor  Contractions are usually shorter and not as strong as true labor contractions.  Contractions are usually irregular.  Contractions are often felt in the front of the lower abdomen and in the groin.  Contractions may go away when you walk around or change positions while lying down.  Contractions get weaker and are shorter-lasting as time goes on.  The cervix usually does not dilate or become thin. Follow these instructions at home:   Take over-the-counter and prescription medicines only as  told by your health care provider.  Keep up with your usual exercises and follow other instructions from your health care provider.  Eat and drink lightly if you think you are going into labor.  If Braxton Hicks contractions are making you uncomfortable: ? Change your position from lying down or resting to walking, or change from walking to resting. ? Sit and rest in a tub of warm water. ? Drink enough fluid to keep your urine pale yellow. Dehydration may cause these contractions. ? Do slow and deep breathing several times an hour.  Keep all follow-up prenatal visits as told by your health care provider. This is important. Contact a health care provider if:  You have a fever.  You have continuous pain in your abdomen. Get help right away if:  Your contractions become stronger, more regular, and closer together.  You have fluid leaking or gushing from your vagina.  You pass blood-tinged mucus (bloody show).  You have bleeding from your vagina.  You have low back pain that you never had before.  You feel your baby's head pushing down and causing pelvic pressure.  Your baby is not moving inside you as much as it used to. Summary  Contractions that occur before labor are called Braxton Hicks contractions, false labor, or practice contractions.  Braxton Hicks contractions are usually shorter, weaker, farther apart, and less regular than true labor contractions. True labor contractions usually  become progressively stronger and regular, and they become more frequent.  [facility] contractions by changing position, resting in a warm bath, drinking plenty of water, or practicing deep breathing. This information is not intended to replace advice given to you by your health care provider. Make sure you discuss any questions you have with your health care provider. Document Released: 02/11/2017 Document Revised: 09/10/2017 Document Reviewed: 02/11/2017 Elsevier  Patient Education  2020 Elsevier Inc. /13/2017 Elsevier Patient Education  2020 [employer]

## 2019-07-28 ENCOUNTER — Telehealth: Payer: Self-pay

## 2019-07-28 LAB — STREP GP B NAA: Strep Gp B NAA: NEGATIVE

## 2019-07-28 NOTE — Telephone Encounter (Signed)
Mychart message sent informing patient disability papers have been faxed and a confirmation of receipt was received. Papers put up front in "m" drawer.

## 2019-08-04 ENCOUNTER — Ambulatory Visit (INDEPENDENT_AMBULATORY_CARE_PROVIDER_SITE_OTHER): Payer: 59 | Admitting: Obstetrics and Gynecology

## 2019-08-04 ENCOUNTER — Other Ambulatory Visit: Payer: Self-pay

## 2019-08-04 VITALS — BP 118/67 | HR 104 | Wt 283.2 lb

## 2019-08-04 DIAGNOSIS — Z3493 Encounter for supervision of normal pregnancy, unspecified, third trimester: Secondary | ICD-10-CM

## 2019-08-04 LAB — POCT URINALYSIS DIPSTICK OB
Bilirubin, UA: NEGATIVE
Blood, UA: NEGATIVE
Glucose, UA: NEGATIVE
Ketones, UA: NEGATIVE
Leukocytes, UA: NEGATIVE
Nitrite, UA: NEGATIVE
POC,PROTEIN,UA: NEGATIVE
Spec Grav, UA: 1.01 (ref 1.010–1.025)
Urobilinogen, UA: 0.2 E.U./dL
pH, UA: 7 (ref 5.0–8.0)

## 2019-08-04 NOTE — Progress Notes (Signed)
ROB- doing well, discussed previous deliveries at 39,40,41, & 39 weeks.

## 2019-08-04 NOTE — Progress Notes (Signed)
Patient is here for an ROB visit. 

## 2019-08-11 ENCOUNTER — Other Ambulatory Visit: Payer: Self-pay

## 2019-08-11 ENCOUNTER — Ambulatory Visit (INDEPENDENT_AMBULATORY_CARE_PROVIDER_SITE_OTHER): Payer: 59 | Admitting: Certified Nurse Midwife

## 2019-08-11 VITALS — BP 120/91 | HR 78 | Wt 286.2 lb

## 2019-08-11 DIAGNOSIS — O0933 Supervision of pregnancy with insufficient antenatal care, third trimester: Secondary | ICD-10-CM | POA: Insufficient documentation

## 2019-08-11 DIAGNOSIS — Z3493 Encounter for supervision of normal pregnancy, unspecified, third trimester: Secondary | ICD-10-CM

## 2019-08-11 DIAGNOSIS — O0930 Supervision of pregnancy with insufficient antenatal care, unspecified trimester: Secondary | ICD-10-CM

## 2019-08-11 DIAGNOSIS — O99213 Obesity complicating pregnancy, third trimester: Secondary | ICD-10-CM | POA: Insufficient documentation

## 2019-08-11 DIAGNOSIS — Z3A39 39 weeks gestation of pregnancy: Secondary | ICD-10-CM

## 2019-08-11 LAB — POCT URINALYSIS DIPSTICK OB
Bilirubin, UA: NEGATIVE
Glucose, UA: NEGATIVE
Ketones, UA: NEGATIVE
Nitrite, UA: NEGATIVE
POC,PROTEIN,UA: NEGATIVE
Spec Grav, UA: 1.02 (ref 1.010–1.025)
Urobilinogen, UA: 0.2 E.U./dL
pH, UA: 6.5 (ref 5.0–8.0)

## 2019-08-11 NOTE — Progress Notes (Signed)
ROB-Doing well, no questions or concerns. Planning home birth without trained provider present. Discussed the importance of skill birth attendant and when to report to the hospital. Reviewed red flag symptoms, signs of pre-eclampsia,  and when to call. RTC x 1 week for ROB or sooner if needed.

## 2019-08-11 NOTE — Patient Instructions (Signed)

## 2019-08-11 NOTE — Progress Notes (Signed)
ROB-No complaints.  BP recheck 124/96, denies HA or visual changes.

## 2019-08-16 ENCOUNTER — Other Ambulatory Visit: Payer: Self-pay

## 2019-08-16 ENCOUNTER — Ambulatory Visit (INDEPENDENT_AMBULATORY_CARE_PROVIDER_SITE_OTHER): Payer: 59 | Admitting: Certified Nurse Midwife

## 2019-08-16 VITALS — BP 115/86 | HR 90 | Wt 280.2 lb

## 2019-08-16 DIAGNOSIS — Z3493 Encounter for supervision of normal pregnancy, unspecified, third trimester: Secondary | ICD-10-CM

## 2019-08-16 LAB — POCT URINALYSIS DIPSTICK OB
Bilirubin, UA: NEGATIVE
Blood, UA: NEGATIVE
Glucose, UA: NEGATIVE
Leukocytes, UA: NEGATIVE
Nitrite, UA: NEGATIVE
POC,PROTEIN,UA: NEGATIVE
Spec Grav, UA: <=1.005 — AB (ref 1.010–1.025)
Urobilinogen, UA: 0.2 E.U./dL
pH, UA: 5 (ref 5.0–8.0)

## 2019-08-16 NOTE — Progress Notes (Signed)
ROB doing well . Feels good movement. Discussed remainder of [place]. Recommendations for delivery discussed. She declines induction at this time. She would like it to be natural. She will follow up on Tuesday for u/s BPP and growth . ROB with Melody. Labor precautions reviewed.   Philip Aspen, CNM

## 2019-08-16 NOTE — Patient Instructions (Signed)
Braxton Hicks Contractions Contractions of the uterus can occur throughout pregnancy, but they are not always a sign that you are in labor. You may have practice contractions called Braxton Hicks contractions. These false labor contractions are sometimes confused with true labor. What are Braxton Hicks contractions? Braxton Hicks contractions are tightening movements that occur in the muscles of the uterus before labor. Unlike true labor contractions, these contractions do not result in opening (dilation) and thinning of the cervix. Toward the end of pregnancy (32-34 weeks), Braxton Hicks contractions can happen more often and may become stronger. These contractions are sometimes difficult to tell apart from true labor because they can be very uncomfortable. You should not feel embarrassed if you go to the hospital with false labor. Sometimes, the only way to tell if you are in true labor is for your health care provider to look for changes in the cervix. The health care provider will do a physical exam and may monitor your contractions. If you are not in true labor, the exam should show that your cervix is not dilating and your water has not broken. If there are no other health problems associated with your pregnancy, it is completely safe for you to be sent home with false labor. You may continue to have Braxton Hicks contractions until you go into true labor. How to tell the difference between true labor and false labor True labor  Contractions last 30-70 seconds.  Contractions become very regular.  Discomfort is usually felt in the top of the uterus, and it spreads to the lower abdomen and low back.  Contractions do not go away with walking.  Contractions usually become more intense and increase in frequency.  The cervix dilates and gets thinner. False labor  Contractions are usually shorter and not as strong as true labor contractions.  Contractions are usually irregular.  Contractions  are often felt in the front of the lower abdomen and in the groin.  Contractions may go away when you walk around or change positions while lying down.  Contractions get weaker and are shorter-lasting as time goes on.  The cervix usually does not dilate or become thin. Follow these instructions at home:   Take over-the-counter and prescription medicines only as told by your health care provider.  Keep up with your usual exercises and follow other instructions from your health care provider.  Eat and drink lightly if you think you are going into labor.  If Braxton Hicks contractions are making you uncomfortable: ? Change your position from lying down or resting to walking, or change from walking to resting. ? Sit and rest in a tub of warm water. ? Drink enough fluid to keep your urine pale yellow. Dehydration may cause these contractions. ? Do slow and deep breathing several times an hour.  Keep all follow-up prenatal visits as told by your health care provider. This is important. Contact a health care provider if:  You have a fever.  You have continuous pain in your abdomen. Get help right away if:  Your contractions become stronger, more regular, and closer together.  You have fluid leaking or gushing from your vagina.  You pass blood-tinged mucus (bloody show).  You have bleeding from your vagina.  You have low back pain that you never had before.  You feel your baby's head pushing down and causing pelvic pressure.  Your baby is not moving inside you as much as it used to. Summary  Contractions that occur before labor are   called Braxton Hicks contractions, false labor, or practice contractions.  Braxton Hicks contractions are usually shorter, weaker, farther apart, and less regular than true labor contractions. True labor contractions usually become progressively stronger and regular, and they become more frequent.  Manage discomfort from Braxton Hicks contractions  by changing position, resting in a warm bath, drinking plenty of water, or practicing deep breathing. This information is not intended to replace advice given to you by your health care provider. Make sure you discuss any questions you have with your health care provider. Document Released: 02/11/2017 Document Revised: 09/10/2017 Document Reviewed: 02/11/2017 Elsevier Patient Education  2020 Elsevier Inc.  

## 2019-08-22 ENCOUNTER — Encounter: Payer: 59 | Admitting: Certified Nurse Midwife

## 2019-08-22 ENCOUNTER — Other Ambulatory Visit: Payer: 59

## 2019-08-28 ENCOUNTER — Encounter: Payer: Self-pay | Admitting: Certified Nurse Midwife

## 2019-08-28 ENCOUNTER — Ambulatory Visit (INDEPENDENT_AMBULATORY_CARE_PROVIDER_SITE_OTHER): Payer: 59 | Admitting: Certified Nurse Midwife

## 2019-08-28 ENCOUNTER — Other Ambulatory Visit: Payer: Self-pay

## 2019-08-28 MED ORDER — NORETHINDRONE 0.35 MG PO TABS: 1.0000 | ORAL_TABLET | Freq: Every day | ORAL | 11 refills | Status: DC

## 2019-08-28 NOTE — Patient Instructions (Signed)
Preventive Care 21-31 Years Old, Female Preventive care refers to visits with your health care provider and lifestyle choices that can promote health and wellness. This includes:  A yearly physical exam. This may also be called an annual well check.  Regular dental visits and eye exams.  Immunizations.  Screening for certain conditions.  Healthy lifestyle choices, such as eating a healthy diet, getting regular exercise, not using drugs or products that contain nicotine and tobacco, and limiting alcohol use. What can I expect for my preventive care visit? Physical exam Your health care provider will check your:  Height and weight. This may be used to calculate body mass index (BMI), which tells if you are at a healthy weight.  Heart rate and blood pressure.  Skin for abnormal spots. Counseling Your health care provider may ask you questions about your:  Alcohol, tobacco, and drug use.  Emotional well-being.  Home and relationship well-being.  Sexual activity.  Eating habits.  Work and work environment.  Method of birth control.  Menstrual cycle.  Pregnancy history. What immunizations do I need?  Influenza (flu) vaccine  This is recommended every year. Tetanus, diphtheria, and pertussis (Tdap) vaccine  You may need a Td booster every 10 years. Varicella (chickenpox) vaccine  You may need this if you have not been vaccinated. Human papillomavirus (HPV) vaccine  If recommended by your health care provider, you may need three doses over 6 months. Measles, mumps, and rubella (MMR) vaccine  You may need at least one dose of MMR. You may also need a second dose. Meningococcal conjugate (MenACWY) vaccine  One dose is recommended if you are age 19-21 years and a first-year college student living in a residence hall, or if you have one of several medical conditions. You may also need additional booster doses. Pneumococcal conjugate (PCV13) vaccine  You may need  this if you have certain conditions and were not previously vaccinated. Pneumococcal polysaccharide (PPSV23) vaccine  You may need one or two doses if you smoke cigarettes or if you have certain conditions. Hepatitis A vaccine  You may need this if you have certain conditions or if you travel or work in places where you may be exposed to hepatitis A. Hepatitis B vaccine  You may need this if you have certain conditions or if you travel or work in places where you may be exposed to hepatitis B. Haemophilus influenzae type b (Hib) vaccine  You may need this if you have certain conditions. You may receive vaccines as individual doses or as more than one vaccine together in one shot (combination vaccines). Talk with your health care provider about the risks and benefits of combination vaccines. What tests do I need?  Blood tests  Lipid and cholesterol levels. These may be checked every 5 years starting at age 20.  Hepatitis C test.  Hepatitis B test. Screening  Diabetes screening. This is done by checking your blood sugar (glucose) after you have not eaten for a while (fasting).  Sexually transmitted disease (STD) testing.  BRCA-related cancer screening. This may be done if you have a family history of breast, ovarian, tubal, or peritoneal cancers.  Pelvic exam and Pap test. This may be done every 3 years starting at age 21. Starting at age 30, this may be done every 5 years if you have a Pap test in combination with an HPV test. Talk with your health care provider about your test results, treatment options, and if necessary, the need for more tests.   Follow these instructions at home: Eating and drinking   Eat a diet that includes fresh fruits and vegetables, whole grains, lean protein, and low-fat dairy.  Take vitamin and mineral supplements as recommended by your health care provider.  Do not drink alcohol if: ? Your health care provider tells you not to drink. ? You are  pregnant, may be pregnant, or are planning to become pregnant.  If you drink alcohol: ? Limit how much you have to 0-1 drink a day. ? Be aware of how much alcohol is in your drink. In the [place] one drink equals one 12 oz bottle of beer (355 mL), one 5 oz glass of wine (148 mL), or one 1 oz glass of hard liquor (44 mL). Lifestyle  Take daily care of your teeth and gums.  Stay active. Exercise for at least 30 minutes on 5 or more days each week.  Do not use any products that contain nicotine or tobacco, such as cigarettes, e-cigarettes, and chewing tobacco. If you need help quitting, ask your health care provider.  If you are sexually active, practice safe sex. Use a condom or other form of birth control (contraception) in order to prevent pregnancy and STIs (sexually transmitted infections). If you plan to become pregnant, see your health care provider for a preconception visit. What's next?  Visit your health care provider once a year for a well check visit.  Ask your health care provider how often you should have your eyes and teeth checked.  Stay up to date on all vaccines. This information is not intended to replace advice given to you by your health care provider. Make sure you discuss any questions you have with your health care provider. Document Released: 11/24/2001 Document Revised: 06/09/2018 Document Reviewed: 06/09/2018 Elsevier Patient Education  2020 Elsevier Inc.  

## 2019-08-28 NOTE — Progress Notes (Signed)
Subjective:    Laurie Floyd is a 33 y.o. [phone] Caucasian female who presents for a postpartum visit. She is 2 weeks postpartum following a spontaneous vaginal delivery at  Home unattended birth on 08/18/19@0930 .. Anesthesia: none. I have fully reviewed the prenatal and intrapartum course. Postpartum course has been WNL Baby's course has been WNL. Baby is feeding by breast. Bleeding no bleeding. Bowel function is normal. Bladder function is normal. Patient is not sexually active. . Contraception method is none. Postpartum depression screening: negative. Score 0.  Last pap 11/25/16 and was normal.  The following portions of the patient's history were reviewed and updated as appropriate: allergies, current medications, past medical history, past surgical history and problem list.  Review of Systems Pertinent items are noted in HPI.   Vitals:   08/28/19 1401  BP: 115/78  Pulse: 81  Weight: 260 lb 6 oz (118.1 kg)  Height: 5\' 7"  (1.702 m)   Patient's last menstrual period was 11/12/2018 (approximate).  Objective:   General:  alert, cooperative and no distress   Breasts:  deferred, no complaints  Lungs: clear to auscultation bilaterally  Heart:  regular rate and rhythm  Abdomen: soft, nontender   Vulva: Normal, intact   Vagina: normal vagina, intact  Cervix:  closed  Corpus: Well-involuted  Adnexa:  Non-palpable  Rectal Exam: No hemorrhoids        Assessment:   Postpartum exam 2 wks s/p SVD Breastfeeding Depression screening Contraception counseling   Plan:  : oral progesterone-only contraceptive Follow up in: PRN  Philip Aspen, CNM

## 2019-09-19 ENCOUNTER — Telehealth: Payer: Self-pay

## 2019-09-19 NOTE — Telephone Encounter (Signed)
mychart message sent

## 2019-09-20 ENCOUNTER — Other Ambulatory Visit: Payer: Self-pay

## 2019-09-20 MED ORDER — NORETHINDRONE 0.35 MG PO TABS: 1.0000 | ORAL_TABLET | Freq: Every day | ORAL | 11 refills | Status: DC

## 2019-09-20 NOTE — Telephone Encounter (Signed)
Refill on OCPs sent to preferred pharmacy. Mychart message sent

## 2019-09-22 ENCOUNTER — Encounter: Payer: 59 | Admitting: Certified Nurse Midwife

## 2020-12-26 ENCOUNTER — Ambulatory Visit (INDEPENDENT_AMBULATORY_CARE_PROVIDER_SITE_OTHER): Payer: 59 | Admitting: Certified Nurse Midwife

## 2020-12-26 ENCOUNTER — Other Ambulatory Visit: Payer: Self-pay

## 2020-12-26 ENCOUNTER — Encounter: Payer: Self-pay | Admitting: Certified Nurse Midwife

## 2020-12-26 VITALS — BP 118/77 | HR 105 | Resp 16 | Ht 67.0 in | Wt 276.7 lb

## 2020-12-26 DIAGNOSIS — O0930 Supervision of pregnancy with insufficient antenatal care, unspecified trimester: Secondary | ICD-10-CM | POA: Diagnosis not present

## 2020-12-26 DIAGNOSIS — Z3689 Encounter for other specified antenatal screening: Secondary | ICD-10-CM

## 2020-12-26 DIAGNOSIS — Z3A26 26 weeks gestation of pregnancy: Secondary | ICD-10-CM | POA: Diagnosis not present

## 2020-12-26 DIAGNOSIS — O99210 Obesity complicating pregnancy, unspecified trimester: Secondary | ICD-10-CM

## 2020-12-26 DIAGNOSIS — N926 Irregular menstruation, unspecified: Secondary | ICD-10-CM

## 2020-12-26 DIAGNOSIS — Z3492 Encounter for supervision of normal pregnancy, unspecified, second trimester: Secondary | ICD-10-CM

## 2020-12-26 DIAGNOSIS — N644 Mastodynia: Secondary | ICD-10-CM

## 2020-12-26 LAB — POCT URINE PREGNANCY: Preg Test, Ur: POSITIVE — AB

## 2020-12-26 NOTE — Progress Notes (Signed)
NEW OB HISTORY AND PHYSICAL  SUBJECTIVE:       Laurie Floyd is a 35 y.o. [id number] female, Patient's last menstrual period was 06/21/2020., Estimated Date of Delivery: 03/28/2021 presents today for establishment of Prenatal Care.  Endorses back pain, breast tenderness (still nursing), reflux and allergies.   Symptoms relieved with home treatment measures.  Denies difficulty breathing, respiratory distress, chest pain, vaginal bleeding, dysuria, cramping, and leg pain or swelling.   Gynecologic History  Patient's last menstrual period was 06/21/2020.   Contraception: none  Last Pap: 11/25/2016. Results were: normal  Obstetric History  OB History  Gravida Para Term Preterm AB Living  7 5 5   1 5   SAB IAB Ectopic Multiple Live Births  1     0 5    # Outcome Date GA Lbr Len/2nd Weight Sex Delivery Anes PTL Lv  7 Current           6 Term 08/18/19    M Vag-Spont   LIV  5 Term 12/08/17 [redacted]w[redacted]d  4309 g M Vag-Spont None  LIV  4 Term 12/11/11 [redacted]w[redacted]d  2327 g M Vag-Spont  N LIV  3 Term 11/26/09 [redacted]w[redacted]d  2722 g F Vag-Spont  N LIV  2 Term 01/17/08 [redacted]w[redacted]d  3175 g M Vag-Spont   LIV  1 SAB 2006        ND    Obstetric Comments  Patient had this pregnancy at home    Past Medical History:  Diagnosis Date  . Anxiety     Past Surgical History:  Procedure Laterality Date  . NO PAST SURGERIES      Current Outpatient Medications on File Prior to Visit  Medication Sig Dispense Refill  . norethindrone (MICRONOR) 0.35 MG tablet Take 1 tablet (0.35 mg total) by mouth daily. (Patient not taking: Reported on 12/26/2020) 1 Package 11   No current facility-administered medications on file prior to visit.    No Known Allergies  Social History   Socioeconomic History  . Marital status: Unknown    Spouse name: Not on file  . Number of children: 3  . Years of education: Not on file  . Highest education level: Not on file  Occupational History  . Not on file  Tobacco Use  . Smoking status: Former  Smoker    Types: Cigarettes    Start date: 02/19/2017  . Smokeless tobacco: Never Used  Vaping Use  . Vaping Use: Never used  Substance and Sexual Activity  . Alcohol use: No    Alcohol/week: 2.0 standard drinks    Types: 2 Standard drinks or equivalent per week  . Drug use: No  . Sexual activity: Yes    Birth control/protection: None  Other Topics Concern  . Not on file  Social History Narrative  . Not on file   Social Determinants of Health   Financial Resource Strain: Not on file  Food Insecurity: Not on file  Transportation Needs: Not on file  Physical Activity: Not on file  Stress: Not on file  Social Connections: Not on file  Intimate Partner Violence: Not on file    Family History  Problem Relation Age of Onset  . Hypertension Mother   . Hypertension Father   . Diabetes Father     The following portions of the patient's history were reviewed and updated as appropriate: allergies, current medications, past OB history, past medical history, past surgical history, past family history, past social history, and problem list.   Review  of Systems:  ROS- Negative other than what was reported above. Information obtained verbally from patient.  OBJECTIVE:  BP 118/77   Pulse (!) 105   Resp 16   Ht 5\' 7"  (1.702 m)   Wt 125.5 kg   LMP 06/21/2020   BMI 43.34 kg/m    Initial Physical Exam (New OB)  GENERAL APPEARANCE: alert, well appearing, in no apparent distress, oriented to person, place and time  HEAD: normocephalic, atraumatic  MOUTH: mucous membranes moist, pharynx normal without lesions  THYROID: no thyromegaly or masses present  BREASTS: lactating, patient declined exam  LUNGS: clear to auscultation, no wheezes, rales or rhonchi, symmetric air entry  HEART: regular rate and rhythm, no murmurs  ABDOMEN: soft, nontender, nondistended, no abnormal masses, no epigastric pain, obese, fundus soft, nontender 27 cm and FHT present  EXTREMITIES: no  redness or tenderness in the calves or thighs, no edema, no limitation in range of motion  NEUROLOGIC: alert, oriented, normal speech, no focal findings or movement disorder noted  PELVIC EXAM: Patient declined exam an pap today  ASSESSMENT:  Normal pregnancy  Late to Prenatal care  Missed Menses  Second Trimester Pregnancy  [redacted] weeks gestation of pregnancy  Obesity  Breast tenderness  Lactating Mother  PLAN:  Advised patient of office prenatal care protocol. Encouraged patient to have a hospital birth at [facility].  Patient desires a home birth, declines all labs, glucose, and GBS screening during this pregnancy.  Encouraged patient to take prenatal vitamin with DHA and Folic acid.  Shared decision making regarding course of prenatal care, patient desires ANATOMY SCAN, and monthly prenatal appointments at this time.  Reviewed red flag symptoms and when to call.  RTC x 4 weeks for ROB or sooner if needed  [facility], Juliann Pares 12/26/20 4:53 PM

## 2020-12-26 NOTE — Patient Instructions (Addendum)
Back Pain in Pregnancy Back pain during pregnancy is common. Back pain may be caused by several factors that are related to changes during your pregnancy. Follow these instructions at home: Managing pain, stiffness, and swelling  If directed, for sudden (acute) back pain, put ice on the painful area. ? Put ice in a plastic bag. ? Place a towel between your skin and the bag. ? Leave the ice on for 20 minutes, 2-3 times per day.  If directed, apply heat to the affected area before you exercise. Use the heat source that your health care provider recommends, such as a moist heat pack or a heating pad. ? Place a towel between your skin and the heat source. ? Leave the heat on for 20-30 minutes. ? Remove the heat if your skin turns bright red. This is especially important if you are unable to feel pain, heat, or cold. You may have a greater risk of getting burned.  If directed, massage the affected area.      Activity  Exercise as told by your health care provider. Gentle exercise is the best way to prevent or manage back pain.  Listen to your body when lifting. If lifting hurts, ask for help or bend your knees. This uses your leg muscles instead of your back muscles.  Squat down when picking up something from the floor. Do not bend over.  Only use bed rest for short periods as told by your health care provider. Bed rest should only be used for the most severe episodes of back pain. Standing, sitting, and lying down  Do not stand in one place for long periods of time.  Use good posture when sitting. Make sure your head rests over your shoulders and is not hanging forward. Use a pillow on your lower back if necessary.  Try sleeping on your side, preferably the left side, with a pregnancy support pillow or 1-2 regular pillows between your legs. ? If you have back pain after a night's rest, your bed may be too soft. ? A firm mattress may provide more support for your back during  pregnancy. General instructions  Do not wear high heels.  Eat a healthy diet. Try to gain weight within your health care provider's recommendations.  Use a maternity girdle, elastic sling, or back brace as told by your health care provider.  Take over-the-counter and prescription medicines only as told by your health care provider.  Work with a physical therapist or massage therapist to find ways to manage back pain. Acupuncture or massage therapy may be helpful.  Keep all follow-up visits as told by your health care provider. This is important. Contact a health care provider if:  Your back pain interferes with your daily activities.  You have increasing pain in other parts of your body. Get help right away if:  You develop numbness, tingling, weakness, or problems with the use of your arms or legs.  You develop severe back pain that is not controlled with medicine.  You have a change in bowel or bladder control.  You develop shortness of breath, dizziness, or you faint.  You develop nausea, vomiting, or sweating.  You have back pain that is a rhythmic, cramping pain similar to labor pains. Labor pain is usually 1-2 minutes apart, lasts for about 1 minute, and involves a bearing down feeling or pressure in your pelvis.  You have back pain and your water breaks or you have vaginal bleeding.  You have back pain or   numbness that travels down your leg.  Your back pain developed after you fell.  You develop pain on one side of your back.  You see blood in your urine.  You develop skin blisters in the area of your back pain. Summary  Back pain may be caused by several factors that are related to changes during your pregnancy.  Follow instructions as told by your health care provider for managing pain, stiffness, and swelling.  Exercise as told by your health care provider. Gentle exercise is the best way to prevent or manage back pain.  Take over-the-counter and  prescription medicines only as told by your health care provider.  Keep all follow-up visits as told by your health care provider. This is important. This information is not intended to replace advice given to you by your health care provider. Make sure you discuss any questions you have with your health care provider. Document Revised: 01/17/2019 Document Reviewed: 03/16/2018 Elsevier Patient Education  2021 [place] of Pregnancy  The second trimester of pregnancy is from week 13 through week 27. This is also called months 4 through 6 of pregnancy. This is often the time when you feel your best. During the second trimester:  Morning sickness is less or has stopped.  You may have more energy.  You may feel hungry more often. At this time, your unborn baby (fetus) is growing very fast. At the end of the sixth month, the unborn baby may be up to 12 inches long and weigh about 1 pounds. You will likely start to feel the baby move between 16 and 20 weeks of pregnancy. Body changes during your second trimester Your body continues to go through many changes during this time. The changes vary and generally return to normal after the baby is born. Physical changes  You will gain more weight.  You may start to get stretch marks on your hips, belly (abdomen), and breasts.  Your breasts will grow and may hurt.  Dark spots or blotches may develop on your face.  A dark line from your belly button to the pubic area (linea nigra) may appear.  You may have changes in your hair. Health changes  You may have headaches.  You may have heartburn.  You may have trouble pooping (constipation).  You may have hemorrhoids or swollen, bulging veins (varicose veins).  Your gums may bleed.  You may pee (urinate) more often.  You may have back pain. Follow these instructions at home: Medicines  Take over-the-counter and prescription medicines only as told by your  doctor. Some medicines are not safe during pregnancy.  Take a prenatal vitamin that contains at least 600 micrograms (mcg) of folic acid. Eating and drinking  Eat healthy meals that include: ? Fresh fruits and vegetables. ? Whole grains. ? Good sources of protein, such as meat, eggs, or tofu. ? Low-fat dairy products.  Avoid raw meat and unpasteurized juice, milk, and cheese.  You may need to take these actions to prevent or treat trouble pooping: ? Drink enough fluids to keep your pee (urine) pale yellow. ? Eat foods that are high in fiber. These include beans, whole grains, and fresh fruits and vegetables. ? Limit foods that are high in fat and sugar. These include fried or sweet foods. Activity  Exercise only as told by your doctor. Most people can do their usual exercise during pregnancy. Try to exercise for 30 minutes at least 5 days a week.  Stop  exercising if you have pain or cramps in your belly or lower back.  Do not exercise if it is too hot or too humid, or if you are in a place of great height (high altitude).  Avoid heavy lifting.  If you choose to, you may have sex unless your doctor tells you not to. Relieving pain and discomfort  Wear a good support bra if your breasts are sore.  Take warm water baths (sitz baths) to soothe pain or discomfort caused by hemorrhoids. Use hemorrhoid cream if your doctor approves.  Rest with your legs raised (elevated) if you have leg cramps or low back pain.  If you develop bulging veins in your legs: ? Wear support hose as told by your doctor. ? Raise your feet for 15 minutes, 3-4 times a day. ? Limit salt in your food. Safety  Wear your seat belt at all times when you are in a car.  Talk with your doctor if someone is hurting you or yelling at you a lot. Lifestyle  Do not use hot tubs, steam rooms, or saunas.  Do not douche. Do not use tampons or scented sanitary pads.  Avoid cat litter boxes and soil used by cats.  These carry germs that can harm your baby and can cause a loss of your baby by miscarriage or stillbirth.  Do not use herbal medicines, illegal drugs, or medicines that are not approved by your doctor. Do not drink alcohol.  Do not smoke or use any products that contain nicotine or tobacco. If you need help quitting, ask your doctor. General instructions  Keep all follow-up visits. This is important.  Ask your doctor about local prenatal classes.  Ask your doctor about the right foods to eat or for help finding a counselor. Where to find more information  American Pregnancy Association: americanpregnancy.org  [employer]nec[employer]: www.acog.org  Office on [employer]: Key[employer]Portfol[employer]com.br Contact a doctor if:  You have a headache that does not go away when you take medicine.  You have changes in how you see, or you see spots in front of your eyes.  You have mild cramps, pressure, or pain in your lower belly.  You continue to feel like you may vomit (nauseous), you vomit, or you have watery poop (diarrhea).  You have bad-smelling fluid coming from your vagina.  You have pain when you pee or your pee smells bad.  You have very bad swelling of your face, hands, ankles, feet, or legs.  You have a fever. Get help right away if:  You are leaking fluid from your vagina.  You have spotting or bleeding from your vagina.  You have very bad belly cramping or pain.  You have trouble breathing.  You have chest pain.  You faint.  You have not felt your baby move for the time period told by your doctor.  You have new or increased pain, swelling, or redness in an arm or leg. Summary  The second trimester of pregnancy is from week 13 through week 27 (months 4 through 6).  Eat healthy meals.  Exercise as told by your doctor. Most people can do their usual exercise during pregnancy.  Do not use herbal medicines, illegal drugs, or  medicines that are not approved by your doctor. Do not drink alcohol.  Call your doctor if you get sick or if you notice anything unusual about your pregnancy. This information is not intended to replace advice given to you by your  health care provider. Make sure you discuss any questions you have with your health care provider. Document Revised: 03/06/2020 Document Reviewed: 01/11/2020 Elsevier Patient Education  2021 Elsevier Inc.  

## 2020-12-26 NOTE — Progress Notes (Signed)
I have seen, interviewed, and examined the patient in conjunction with the [employer] and affirm the diagnosis and management plan.   Gunnar Bulla, CNM [facility], [facility] 12/26/20 5:15 PM

## 2020-12-31 ENCOUNTER — Other Ambulatory Visit: Payer: Self-pay | Admitting: Certified Nurse Midwife

## 2020-12-31 DIAGNOSIS — Z3689 Encounter for other specified antenatal screening: Secondary | ICD-10-CM

## 2020-12-31 DIAGNOSIS — O99212 Obesity complicating pregnancy, second trimester: Secondary | ICD-10-CM

## 2020-12-31 DIAGNOSIS — O0930 Supervision of pregnancy with insufficient antenatal care, unspecified trimester: Secondary | ICD-10-CM

## 2021-01-23 ENCOUNTER — Other Ambulatory Visit: Payer: Self-pay

## 2021-01-23 ENCOUNTER — Ambulatory Visit (INDEPENDENT_AMBULATORY_CARE_PROVIDER_SITE_OTHER): Payer: 59 | Admitting: Certified Nurse Midwife

## 2021-01-23 ENCOUNTER — Encounter: Payer: Self-pay | Admitting: Certified Nurse Midwife

## 2021-01-23 VITALS — BP 112/77 | HR 91 | Wt 276.0 lb

## 2021-01-23 DIAGNOSIS — Z3483 Encounter for supervision of other normal pregnancy, third trimester: Secondary | ICD-10-CM

## 2021-01-23 DIAGNOSIS — Z3A30 30 weeks gestation of pregnancy: Secondary | ICD-10-CM

## 2021-01-23 LAB — POCT URINALYSIS DIPSTICK OB
Bilirubin, UA: NEGATIVE
Blood, UA: NEGATIVE
Glucose, UA: NEGATIVE
Ketones, UA: NEGATIVE
Leukocytes, UA: NEGATIVE
Nitrite, UA: NEGATIVE
POC,PROTEIN,UA: NEGATIVE
Spec Grav, UA: 1.01 (ref 1.010–1.025)
Urobilinogen, UA: 0.2 E.U./dL
pH, UA: 7.5 (ref 5.0–8.0)

## 2021-01-23 NOTE — Progress Notes (Signed)
ROB- Doing well overall, no concerns today. Planning home birth. Has appointment with MFM on 4/19. Anticipatory guidance regarding course of prenatal care. Reviewed red flag symptoms and when to call. RTC x 4 weeks for ROB with [facility] or sooner if needed.  Juliann Pares, Student-Mid[employer]ife [employer] 01/23/21 4:43 PM

## 2021-01-23 NOTE — Patient Instructions (Signed)
 Fetal Movement Counts Patient Name: ________________________________________________ Patient Due Date: ____________________  What is a fetal movement count? A fetal movement count is the number of times that you feel your baby move during a certain amount of time. This may also be called a fetal kick count. A fetal movement count is recommended for every pregnant woman. You may be asked to start counting fetal movements as early as week 28 of your pregnancy. Pay attention to when your baby is most active. You may notice your baby's sleep and wake cycles. You may also notice things that make your baby move more. You should do a fetal movement count:  When your baby is normally most active.  At the same time each day. A good time to count movements is while you are resting, after having something to eat and drink. How do I count fetal movements? 1. Find a quiet, comfortable area. Sit, or lie down on your side. 2. Write down the date, the start time and stop time, and the number of movements that you felt between those two times. Take this information with you to your health care visits. 3. Write down your start time when you feel the first movement. 4. Count kicks, flutters, swishes, rolls, and jabs. You should feel at least 10 movements. 5. You may stop counting after you have felt 10 movements, or if you have been counting for 2 hours. Write down the stop time. 6. If you do not feel 10 movements in 2 hours, contact your health care provider for further instructions. Your health care provider may want to do additional tests to assess your baby's well-being. Contact a health care provider if:  You feel fewer than 10 movements in 2 hours.  Your baby is not moving like he or she usually does. Date: ____________ Start time: ____________ Stop time: ____________ Movements: ____________ Date: ____________ Start time: ____________ Stop time: ____________ Movements: ____________ Date: ____________  Start time: ____________ Stop time: ____________ Movements: ____________ Date: ____________ Start time: ____________ Stop time: ____________ Movements: ____________ Date: ____________ Start time: ____________ Stop time: ____________ Movements: ____________ Date: ____________ Start time: ____________ Stop time: ____________ Movements: ____________ Date: ____________ Start time: ____________ Stop time: ____________ Movements: ____________ Date: ____________ Start time: ____________ Stop time: ____________ Movements: ____________ Date: ____________ Start time: ____________ Stop time: ____________ Movements: ____________ This information is not intended to replace advice given to you by your health care provider. Make sure you discuss any questions you have with your health care provider. Document Revised: 05/18/2019 Document Reviewed: 05/18/2019 Elsevier Patient Education  2021 Elsevier Inc.    Third Trimester of Pregnancy  The third trimester of pregnancy is from week 28 through week 40. This is also called months 7 through 9. This trimester is when your unborn baby (fetus) is growing very fast. At the end of the ninth month, the unborn baby is about 20 inches long. It weighs about 6-10 pounds. Body changes during your third trimester Your body continues to go through many changes during this time. The changes vary and generally return to normal after the baby is born. Physical changes  Your weight will continue to increase. You may gain 25-35 pounds (11-16 kg) by the end of the pregnancy. If you are underweight, you may gain 28-40 lb (about 13-18 kg). If you are overweight, you may gain 15-25 lb (about 7-11 kg).  You may start to get stretch marks on your hips, belly (abdomen), and breasts.  Your breasts will continue to   grow and may hurt. A yellow fluid (colostrum) may leak from your breasts. This is the first milk you are making for your baby.  You may have changes in your hair.  Your  belly button may stick out.  You may have more swelling in your hands, face, or ankles. Health changes  You may have heartburn.  You may have trouble pooping (constipation).  You may get hemorrhoids. These are swollen veins in the butt that can itch or get painful.  You may have swollen veins (varicose veins) in your legs.  You may have more body aches in the pelvis, back, or thighs.  You may have more tingling or numbness in your hands, arms, and legs. The skin on your belly may also feel numb.  You may feel short of breath as your womb (uterus) gets bigger. Other changes  You may pee (urinate) more often.  You may have more problems sleeping.  You may notice the unborn baby "dropping," or moving lower in your belly.  You may have more discharge coming from your vagina.  Your joints may feel loose, and you may have pain around your pelvic bone. Follow these instructions at home: Medicines  Take over-the-counter and prescription medicines only as told by your doctor. Some medicines are not safe during pregnancy.  Take a prenatal vitamin that contains at least 600 micrograms (mcg) of folic acid. Eating and drinking  Eat healthy meals that include: ? Fresh fruits and vegetables. ? Whole grains. ? Good sources of protein, such as meat, eggs, or tofu. ? Low-fat dairy products.  Avoid raw meat and unpasteurized juice, milk, and cheese. These carry germs that can harm you and your baby.  Eat 4 or 5 small meals rather than 3 large meals a day.  You may need to take these actions to prevent or treat trouble pooping: ? Drink enough fluids to keep your pee (urine) pale yellow. ? Eat foods that are high in fiber. These include beans, whole grains, and fresh fruits and vegetables. ? Limit foods that are high in fat and sugar. These include fried or sweet foods. Activity  Exercise only as told by your doctor. Stop exercising if you start to have cramps in your womb.  Avoid  heavy lifting.  Do not exercise if it is too hot or too humid, or if you are in a place of great height (high altitude).  If you choose to, you may have sex unless your doctor tells you not to. Relieving pain and discomfort  Take breaks often, and rest with your legs raised (elevated) if you have leg cramps or low back pain.  Take warm water baths (sitz baths) to soothe pain or discomfort caused by hemorrhoids. Use hemorrhoid cream if your doctor approves.  Wear a good support bra if your breasts are tender.  If you develop bulging, swollen veins in your legs: ? Wear support hose as told by your doctor. ? Raise your feet for 15 minutes, 3-4 times a day. ? Limit salt in your food. Safety  Talk to your doctor before traveling far distances.  Do not use hot tubs, steam rooms, or saunas.  Wear your seat belt at all times when you are in a car.  Talk with your doctor if someone is hurting you or yelling at you a lot. Preparing for your baby's arrival To prepare for the arrival of your baby:  Take prenatal classes.  Visit the hospital and tour the maternity area.  Buy   a rear-facing car seat. Learn how to install it in your car.  Prepare the baby's room. Take out all pillows and stuffed animals from the baby's crib. General instructions  Avoid cat litter boxes and soil used by cats. These carry germs that can cause harm to the baby and can cause a loss of your baby by miscarriage or stillbirth.  Do not douche or use tampons. Do not use scented sanitary pads.  Do not smoke or use any products that contain nicotine or tobacco. If you need help quitting, ask your doctor.  Do not drink alcohol.  Do not use herbal medicines, illegal drugs, or medicines that were not approved by your doctor. Chemicals in these products can affect your baby.  Keep all follow-up visits. This is important. Where to find more information  American Pregnancy Association:  americanpregnancy.org  American College of Obstetricians and Gynecologists: www.acog.org  Office on Women's Health: womenshealth.gov/pregnancy Contact a doctor if:  You have a fever.  You have mild cramps or pressure in your lower belly.  You have a nagging pain in your belly area.  You vomit, or you have watery poop (diarrhea).  You have bad-smelling fluid coming from your vagina.  You have pain when you pee, or your pee smells bad.  You have a headache that does not go away when you take medicine.  You have changes in how you see, or you see spots in front of your eyes. Get help right away if:  Your water breaks.  You have regular contractions that are less than 5 minutes apart.  You are spotting or bleeding from your vagina.  You have very bad belly cramps or pain.  You have trouble breathing.  You have chest pain.  You faint.  You have not felt the baby move for the amount of time told by your doctor.  You have new or increased pain, swelling, or redness in an arm or leg. Summary  The third trimester is from week 28 through week 40 (months 7 through 9). This is the time when your unborn baby is growing very fast.  During this time, your discomfort may increase as you gain weight and as your baby grows.  Get ready for your baby to arrive by taking prenatal classes, buying a rear-facing car seat, and preparing the baby's room.  Get help right away if you are bleeding from your vagina, you have chest pain and trouble breathing, or you have not felt the baby move for the amount of time told by your doctor. This information is not intended to replace advice given to you by your health care provider. Make sure you discuss any questions you have with your health care provider. Document Revised: 03/06/2020 Document Reviewed: 01/11/2020 Elsevier Patient Education  2021 Elsevier Inc.  

## 2021-01-23 NOTE — Progress Notes (Signed)
ROB: Doing well, no concerns. 

## 2021-01-23 NOTE — Progress Notes (Signed)
I have seen, interviewed, and examined the patient in conjunction with the [employer] and affirm the diagnosis and management plan.   Gunnar Bulla, CNM [facility], [facility] 01/23/21 5:01 PM

## 2021-01-28 ENCOUNTER — Ambulatory Visit: Payer: 59 | Attending: Maternal & Fetal Medicine

## 2021-01-28 ENCOUNTER — Other Ambulatory Visit: Payer: Self-pay

## 2021-01-28 DIAGNOSIS — Z3A33 33 weeks gestation of pregnancy: Secondary | ICD-10-CM

## 2021-01-28 DIAGNOSIS — O0930 Supervision of pregnancy with insufficient antenatal care, unspecified trimester: Secondary | ICD-10-CM

## 2021-01-28 DIAGNOSIS — O0933 Supervision of pregnancy with insufficient antenatal care, third trimester: Secondary | ICD-10-CM | POA: Insufficient documentation

## 2021-01-28 DIAGNOSIS — O99212 Obesity complicating pregnancy, second trimester: Secondary | ICD-10-CM

## 2021-01-28 DIAGNOSIS — E669 Obesity, unspecified: Secondary | ICD-10-CM | POA: Insufficient documentation

## 2021-01-28 DIAGNOSIS — Z3689 Encounter for other specified antenatal screening: Secondary | ICD-10-CM

## 2021-01-28 DIAGNOSIS — O99213 Obesity complicating pregnancy, third trimester: Secondary | ICD-10-CM | POA: Insufficient documentation

## 2021-02-03 ENCOUNTER — Other Ambulatory Visit: Payer: Self-pay | Admitting: Internal Medicine

## 2021-02-03 DIAGNOSIS — O99213 Obesity complicating pregnancy, third trimester: Secondary | ICD-10-CM

## 2021-02-13 ENCOUNTER — Other Ambulatory Visit: Payer: Self-pay

## 2021-02-13 ENCOUNTER — Ambulatory Visit: Payer: 59 | Attending: Maternal & Fetal Medicine

## 2021-02-13 DIAGNOSIS — Z3A36 36 weeks gestation of pregnancy: Secondary | ICD-10-CM | POA: Diagnosis not present

## 2021-02-13 DIAGNOSIS — O0933 Supervision of pregnancy with insufficient antenatal care, third trimester: Secondary | ICD-10-CM | POA: Insufficient documentation

## 2021-02-13 DIAGNOSIS — O99213 Obesity complicating pregnancy, third trimester: Secondary | ICD-10-CM | POA: Insufficient documentation

## 2021-02-13 DIAGNOSIS — E669 Obesity, unspecified: Secondary | ICD-10-CM

## 2021-02-21 ENCOUNTER — Encounter: Payer: Self-pay | Admitting: Certified Nurse Midwife

## 2021-02-21 ENCOUNTER — Ambulatory Visit (INDEPENDENT_AMBULATORY_CARE_PROVIDER_SITE_OTHER): Payer: 59 | Admitting: Certified Nurse Midwife

## 2021-02-21 ENCOUNTER — Other Ambulatory Visit: Payer: Self-pay

## 2021-02-21 VITALS — BP 91/58 | HR 109 | Wt 280.9 lb

## 2021-02-21 DIAGNOSIS — O99210 Obesity complicating pregnancy, unspecified trimester: Secondary | ICD-10-CM

## 2021-02-21 DIAGNOSIS — Z3483 Encounter for supervision of other normal pregnancy, third trimester: Secondary | ICD-10-CM

## 2021-02-21 DIAGNOSIS — Z0289 Encounter for other administrative examinations: Secondary | ICD-10-CM

## 2021-02-21 DIAGNOSIS — Z3A35 35 weeks gestation of pregnancy: Secondary | ICD-10-CM

## 2021-02-21 DIAGNOSIS — Z3403 Encounter for supervision of normal first pregnancy, third trimester: Secondary | ICD-10-CM

## 2021-02-21 DIAGNOSIS — R319 Hematuria, unspecified: Secondary | ICD-10-CM

## 2021-02-21 LAB — POCT URINALYSIS DIPSTICK OB
Bilirubin, UA: NEGATIVE
Blood, UA: POSITIVE
Glucose, UA: NEGATIVE
Ketones, UA: POSITIVE
Leukocytes, UA: NEGATIVE
Nitrite, UA: NEGATIVE
Spec Grav, UA: 1.02 (ref 1.010–1.025)
Urobilinogen, UA: 0.2 E.U./dL
pH, UA: 6.5 (ref 5.0–8.0)

## 2021-02-21 NOTE — Progress Notes (Signed)
ROB: Patient is doing well today, no new concerns.

## 2021-02-21 NOTE — Patient Instructions (Addendum)
Rosen's Emergency Medicine: Concepts and Clinical Practice (9th ed., pp. 2296- 2312). Elsevier.">  Braxton Hicks Contractions Contractions of the uterus can occur throughout pregnancy, but they are not always a sign that you are in labor. You may have practice contractions called Braxton Hicks contractions. These false labor contractions are sometimes confused with true labor. What are Braxton Hicks contractions? Braxton Hicks contractions are tightening movements that occur in the muscles of the uterus before labor. Unlike true labor contractions, these contractions do not result in opening (dilation) and thinning of the cervix. Toward the end of pregnancy (32-34 weeks), Braxton Hicks contractions can happen more often and may become stronger. These contractions are sometimes difficult to tell apart from true labor because they can be very uncomfortable. You should not feel embarrassed if you go to the hospital with false labor. Sometimes, the only way to tell if you are in true labor is for your health care provider to look for changes in the cervix. The health care provider will do a physical exam and may monitor your contractions. If you are not in true labor, the exam should show that your cervix is not dilating and your water has not broken. If there are no other health problems associated with your pregnancy, it is completely safe for you to be sent home with false labor. You may continue to have Braxton Hicks contractions until you go into true labor. How to tell the difference between true labor and false labor True labor  Contractions last 30-70 seconds.  Contractions become very regular.  Discomfort is usually felt in the top of the uterus, and it spreads to the lower abdomen and low back.  Contractions do not go away with walking.  Contractions usually become more intense and increase in frequency.  The cervix dilates and gets thinner. False labor  Contractions are usually shorter  and not as strong as true labor contractions.  Contractions are usually irregular.  Contractions are often felt in the front of the lower abdomen and in the groin.  Contractions may go away when you walk around or change positions while lying down.  Contractions get weaker and are shorter-lasting as time goes on.  The cervix usually does not dilate or become thin. Follow these instructions at home:  Take over-the-counter and prescription medicines only as told by your health care provider.  Keep up with your usual exercises and follow other instructions from your health care provider.  Eat and drink lightly if you think you are going into labor.  If Braxton Hicks contractions are making you uncomfortable: ? Change your position from lying down or resting to walking, or change from walking to resting. ? Sit and rest in a tub of warm water. ? Drink enough fluid to keep your urine pale yellow. Dehydration may cause these contractions. ? Do slow and deep breathing several times an hour.  Keep all follow-up prenatal visits as told by your health care provider. This is important.   Contact a health care provider if:  You have a fever.  You have continuous pain in your abdomen. Get help right away if:  Your contractions become stronger, more regular, and closer together.  You have fluid leaking or gushing from your vagina.  You pass blood-tinged mucus (bloody show).  You have bleeding from your vagina.  You have low back pain that you never had before.  You feel your baby's head pushing down and causing pelvic pressure.  Your baby is not moving inside   you as much as it used to. Summary  Contractions that occur before labor are called Braxton Hicks contractions, false labor, or practice contractions.  Braxton Hicks contractions are usually shorter, weaker, farther apart, and less regular than true labor contractions. True labor contractions usually become progressively  stronger and regular, and they become more frequent.  Manage discomfort from [facility] contractions by changing position, resting in a warm bath, drinking plenty of water, or practicing deep breathing. This information is not intended to replace advice given to you by your health care provider. Make sure you discuss any questions you have with your health care provider. Document Revised: 09/10/2017 Document Reviewed: 02/11/2017 Elsevier Patient Education  2021 Elsevier Inc.   Fetal Movement Counts Patient Name: ________________________________________________ Patient Due Date: ____________________  What is a fetal movement count? A fetal movement count is the number of times that you feel your baby move during a certain amount of time. This may also be called a fetal kick count. A fetal movement count is recommended for every pregnant woman. You may be asked to start counting fetal movements as early as week 28 of your pregnancy. Pay attention to when your baby is most active. You may notice your baby's sleep and wake cycles. You may also notice things that make your baby move more. You should do a fetal movement count:  When your baby is normally most active.  At the same time each day. A good time to count movements is while you are resting, after having something to eat and drink. How do I count fetal movements? 1. Find a quiet, comfortable area. Sit, or lie down on your side. 2. Write down the date, the start time and stop time, and the number of movements that you felt between those two times. Take this information with you to your health care visits. 3. Write down your start time when you feel the first movement. 4. Count kicks, flutters, swishes, rolls, and jabs. You should feel at least 10 movements. 5. You may stop counting after you have felt 10 movements, or if you have been counting for 2 hours. Write down the stop time. 6. If you do not feel 10 movements in 2 hours, contact  your health care provider for further instructions. Your health care provider may want to do additional tests to assess your baby's well-being. Contact a health care provider if:  You feel fewer than 10 movements in 2 hours.  Your baby is not moving like he or she usually does. Date: ____________ Start time: ____________ Stop time: ____________ Movements: ____________ Date: ____________ Start time: ____________ Stop time: ____________ Movements: ____________ Date: ____________ Start time: ____________ Stop time: ____________ Movements: ____________ Date: ____________ Start time: ____________ Stop time: ____________ Movements: ____________ Date: ____________ Start time: ____________ Stop time: ____________ Movements: ____________ Date: ____________ Start time: ____________ Stop time: ____________ Movements: ____________ Date: ____________ Start time: ____________ Stop time: ____________ Movements: ____________ Date: ____________ Start time: ____________ Stop time: ____________ Movements: ____________ Date: ____________ Start time: ____________ Stop time: ____________ Movements: ____________ This information is not intended to replace advice given to you by your health care provider. Make sure you discuss any questions you have with your health care provider. Document Revised: 05/18/2019 Document Reviewed: 05/18/2019 Elsevier Patient Education  2021 Elsevier Inc.   Nonstress Test A nonstress test is a procedure that is done during pregnancy in order to check the baby's heartbeat. The procedure can help to show if the baby (fetus) is healthy. It  is commonly done when:  The baby is past his or her due date.  The pregnancy is high risk.  The baby is moving less than normal.  The mother has lost a pregnancy in the past.  The health care provider suspects a problem with the baby's growth.  There is too much or too little amniotic fluid. The procedure is often done in the third trimester  of pregnancy to find out if an early delivery is needed and whether such a delivery is safe. During a nonstress test, the baby's heartbeat is monitored when the baby is resting and when the baby is moving. If the baby is healthy, the heart rate will increase when he or she moves or kicks and will return to normal when he or she rests. Tell a health care provider about:  Any allergies you have.  Any medical conditions you have.  All medicines you are taking, including vitamins, herbs, eye drops, creams, and over-the-counter medicines.  Any surgeries you have had.  Any past pregnancies you have had. What are the risks? There are no risks to you or your baby from a nonstress test. This procedure should not be painful or uncomfortable. What happens before the procedure?  Eat a meal right before the test or as directed by your health care provider. Food may help encourage the baby to move.  Use the restroom right before the test. What happens during the procedure?  Two monitors will be placed on your abdomen. One will record the baby's heart rate and the other will record the contractions of your uterus.  You may be asked to lie down on your side or to sit upright.  You may be given a button to press when you feel your baby move.  Your health care provider will listen to your baby's heartbeat and record it. He or she may also watch your baby's heartbeat on a screen.  If the baby seems to be sleeping, you may be asked to drink some juice or soda, eat a snack, or change positions. The procedure may vary among health care providers and hospitals.   What can I expect after procedure?  Your health care provider will discuss the test results with you and make recommendations for the future. Depending on the results, your health care provider may order additional tests or another course of action.  If your health care provider gave you any diet or activity instructions, make sure to follow  them.  Keep all follow-up visits. This is important. Summary  A nonstress test is a procedure that is done during pregnancy in order to check the baby's heartbeat. The procedure can help show if the baby is healthy.  The procedure is often done in the third trimester of pregnancy to find out if an early delivery is needed and whether such a delivery is safe.  During a nonstress test, the baby's heartbeat is monitored when the baby is resting and when the baby is moving. If the baby is healthy, the heart rate will increase when he or she moves or kicks and will return to normal when he or she rests.  Your health care provider will discuss the test results with you and make recommendations for the future. This information is not intended to replace advice given to you by your health care provider. Make sure you discuss any questions you have with your health care provider. Document Revised: 07/08/2020 Document Reviewed: 07/08/2020 Elsevier Patient Education  2021 [employer].

## 2021-02-21 NOTE — Progress Notes (Signed)
ROB- Doing well overall. Reports some occasional braxton hicks contractions and indigestion manage with home treatment measures, declines Rx at this time. Urine culture today for hematuria, see orders. Discussed MFM recommendations of weekly antenatal testing at 36 weeks; patient verbalized understanding and agrees to NST in two (2) weeks. Patient believes date of LMP might be incorrect and EDD might actually be 03/12/2021. Anticipatory guidance regarding course of prenatal care. Reviewed red flag symptoms and when to call. RTC x 2 weeks for NST and ROB with JML or sooner if needed.  Juliann Pares, Student-Mid[employer]ife [employer] 02/21/21 4:33 PM

## 2021-02-24 ENCOUNTER — Other Ambulatory Visit: Payer: Self-pay | Admitting: Certified Nurse Midwife

## 2021-02-24 DIAGNOSIS — O99213 Obesity complicating pregnancy, third trimester: Secondary | ICD-10-CM

## 2021-02-24 DIAGNOSIS — O0933 Supervision of pregnancy with insufficient antenatal care, third trimester: Secondary | ICD-10-CM

## 2021-02-27 ENCOUNTER — Other Ambulatory Visit: Payer: 59

## 2021-03-07 ENCOUNTER — Ambulatory Visit (INDEPENDENT_AMBULATORY_CARE_PROVIDER_SITE_OTHER): Payer: 59 | Admitting: Certified Nurse Midwife

## 2021-03-07 ENCOUNTER — Other Ambulatory Visit: Payer: Self-pay

## 2021-03-07 ENCOUNTER — Encounter: Payer: Self-pay | Admitting: Certified Nurse Midwife

## 2021-03-07 ENCOUNTER — Other Ambulatory Visit: Payer: 59

## 2021-03-07 VITALS — BP 116/83 | HR 90 | Wt 283.6 lb

## 2021-03-07 DIAGNOSIS — Z3A37 37 weeks gestation of pregnancy: Secondary | ICD-10-CM

## 2021-03-07 DIAGNOSIS — O99210 Obesity complicating pregnancy, unspecified trimester: Secondary | ICD-10-CM

## 2021-03-07 DIAGNOSIS — Z3689 Encounter for other specified antenatal screening: Secondary | ICD-10-CM | POA: Diagnosis not present

## 2021-03-07 DIAGNOSIS — Z3483 Encounter for supervision of other normal pregnancy, third trimester: Secondary | ICD-10-CM

## 2021-03-07 LAB — FETAL NONSTRESS TEST

## 2021-03-07 LAB — POCT URINALYSIS DIPSTICK OB
Bilirubin, UA: NEGATIVE
Blood, UA: POSITIVE
Glucose, UA: NEGATIVE
Ketones, UA: NEGATIVE
Nitrite, UA: NEGATIVE
Spec Grav, UA: 1.015 (ref 1.010–1.025)
Urobilinogen, UA: 0.2 E.U./dL
pH, UA: 6 (ref 5.0–8.0)

## 2021-03-07 NOTE — Progress Notes (Signed)
ROB-Reports lower back pain. Discussed home treatment measures. [employer] and [employer] given. Cancelled MFM appointment. Hopeful labor will happen soon, plans home birth. Reviewed red flag symptoms and when to call. RTC x 2 weeks for NST and ROB or sooner if needed.

## 2021-03-07 NOTE — Progress Notes (Signed)
ROB: She continues to have some lower back pain.

## 2021-03-07 NOTE — Patient Instructions (Signed)
Fetal Movement Counts Patient Name: ________________________________________________ Patient Due Date: ____________________  What is a fetal movement count? A fetal movement count is the number of times that you feel your baby move during a certain amount of time. This may also be called a fetal kick count. A fetal movement count is recommended for every pregnant woman. You may be asked to start counting fetal movements as early as week 28 of your pregnancy. Pay attention to when your baby is most active. You may notice your baby's sleep and wake cycles. You may also notice things that make your baby move more. You should do a fetal movement count:  When your baby is normally most active.  At the same time each day. A good time to count movements is while you are resting, after having something to eat and drink. How do I count fetal movements? 1. Find a quiet, comfortable area. Sit, or lie down on your side. 2. Write down the date, the start time and stop time, and the number of movements that you felt between those two times. Take this information with you to your health care visits. 3. Write down your start time when you feel the first movement. 4. Count kicks, flutters, swishes, rolls, and jabs. You should feel at least 10 movements. 5. You may stop counting after you have felt 10 movements, or if you have been counting for 2 hours. Write down the stop time. 6. If you do not feel 10 movements in 2 hours, contact your health care provider for further instructions. Your health care provider may want to do additional tests to assess your baby's well-being. Contact a health care provider if:  You feel fewer than 10 movements in 2 hours.  Your baby is not moving like he or she usually does. Date: ____________ Start time: ____________ Stop time: ____________ Movements: ____________ Date: ____________ Start time: ____________ Stop time: ____________ Movements: ____________ Date: ____________  Start time: ____________ Stop time: ____________ Movements: ____________ Date: ____________ Start time: ____________ Stop time: ____________ Movements: ____________ Date: ____________ Start time: ____________ Stop time: ____________ Movements: ____________ Date: ____________ Start time: ____________ Stop time: ____________ Movements: ____________ Date: ____________ Start time: ____________ Stop time: ____________ Movements: ____________ Date: ____________ Start time: ____________ Stop time: ____________ Movements: ____________ Date: ____________ Start time: ____________ Stop time: ____________ Movements: ____________ This information is not intended to replace advice given to you by your health care provider. Make sure you discuss any questions you have with your health care provider. Document Revised: 05/18/2019 Document Reviewed: 05/18/2019 Elsevier Patient Education  2021 Elsevier Inc[employer]  

## 2021-04-03 ENCOUNTER — Other Ambulatory Visit: Payer: 59

## 2021-04-03 ENCOUNTER — Encounter: Payer: 59 | Admitting: Certified Nurse Midwife

## 2021-05-09 ENCOUNTER — Ambulatory Visit (INDEPENDENT_AMBULATORY_CARE_PROVIDER_SITE_OTHER): Payer: 59 | Admitting: Certified Nurse Midwife

## 2021-05-09 ENCOUNTER — Encounter: Payer: Self-pay | Admitting: Certified Nurse Midwife

## 2021-05-09 ENCOUNTER — Other Ambulatory Visit: Payer: Self-pay

## 2021-05-09 DIAGNOSIS — Z1331 Encounter for screening for depression: Secondary | ICD-10-CM

## 2021-05-09 NOTE — Progress Notes (Signed)
   OBSTETRICS POSTPARTUM CLINIC PROGRESS NOTE  Subjective:     Laurie Floyd is a 35 y.o. [id number] female who presents for a postpartum visit. She is 6 weeks postpartum following a spontaneous vaginal delivery. I have fully reviewed the prenatal and intrapartum course.   The delivery was at 40+4 gestational weeks at home with family members. Anesthesia: none.   Postpartum course has been uncomplicated.   Baby's course has been uncomplicated. Baby is feeding by breast.   Bleeding: patient has not not resumed menses, with Patient's last menstrual period was 06/21/2020.Marland Kitchen   Bowel function is normal. Bladder function is normal. Patient is not sexually active.   Contraception method desired is none.   Postpartum depression screening: negative.   Denies difficulty breathing or respiratory distress, chest pain, abdominal pain, excessive vaginal bleeding, dysuria, and leg pain or swelling.   The following portions of the patient's history were reviewed and updated as appropriate: allergies, current medications, past family history, past medical history, past social history, past surgical history, and problem list.  Review of Systems  Pertinent items noted in HPI and remainder of comprehensive ROS otherwise negative.   Objective:    BP 127/87   Pulse 73   Resp 16   Ht 5\' 7"  (1.702 m)   Wt 265 lb 3.2 oz (120.3 kg)   LMP 06/21/2020   Breastfeeding Yes   BMI 41.54 kg/m    General:  alert and no distress   Breasts:  inspection negative, no nipple discharge or bleeding, no masses or nodularity palpable  Lungs: clear to auscultation bilaterally  Heart:  regular rate and rhythm, S1, S2 normal, no murmur, click, rub or gallop  Abdomen: soft, non-tender; bowel sounds normal; no masses,  no organomegaly   Vulva:  normal  Vagina: normal vagina, no discharge, exudate, lesion, or erythema  Cervix:  no cervical motion tenderness and no lesions  Corpus: normal size, contour, position,  consistency, mobility, non-tender  Adnexa:  normal adnexa and no mass, fullness, tenderness  Rectal Exam: Not performed.     Depression screen [facility] 2/9 05/09/2021 01/17/2018  Decreased Interest 0 0  Down, Depressed, Hopeless 0 0  PHQ - 2 Score 0 0  Altered sleeping 1 1  Tired, decreased energy 2 1  Change in appetite 1 1  Feeling bad or failure about yourself  1 0  Trouble concentrating 0 0  Moving slowly or fidgety/restless 0 0  Suicidal thoughts 0 0  PHQ-9 Score 5 3  Difficult doing work/chores Somewhat difficult -   GAD 7 : Generalized Anxiety Score 05/09/2021  Nervous, Anxious, on Edge 1  Control/stop worrying 1  Worry too much - different things 1  Trouble relaxing 1  Restless 1  Easily annoyed or irritable 1  Afraid - awful might happen 0  Total GAD 7 Score 6  Anxiety Difficulty Somewhat difficult      Assessment:   1. Postpartum care and examination   2. Lactating mother   3. Depression screening negative      Plan:   Encouraged routine health maintenance techniques, see AVS. Declines contraception at this time.   2.   Wishes to return to work at 10 weeks postpartum, work release form sent via 05/11/2021.   3. Follow up in: 3 months for ANNUAL EXAM and PAP or sooner if needed.    [employer], CMN [facility] 05/09/21 2:42 PM

## 2021-05-09 NOTE — Patient Instructions (Signed)
Preventive Care 21-33 Years Old, Female Preventive care refers to lifestyle choices and visits with your health care provider that can promote health and wellness. This includes: A yearly physical exam. This is also called an annual wellness visit. Regular dental and eye exams. Immunizations. Screening for certain conditions. Healthy lifestyle choices, such as: Eating a healthy diet. Getting regular exercise. Not using drugs or products that contain nicotine and tobacco. Limiting alcohol use. What can I expect for my preventive care visit? Physical exam Your health care provider may check your: Height and weight. These may be used to calculate your BMI (body mass index). BMI is a measurement that tells if you are at a healthy weight. Heart rate and blood pressure. Body temperature. Skin for abnormal spots. Counseling Your health care provider may ask you questions about your: Past medical problems. Family's medical history. Alcohol, tobacco, and drug use. Emotional well-being. Home life and relationship well-being. Sexual activity. Diet, exercise, and sleep habits. Work and work environment. Access to firearms. Method of birth control. Menstrual cycle. Pregnancy history. What immunizations do I need?  Vaccines are usually given at various ages, according to a schedule. Your health care provider will recommend vaccines for you based on your age, medicalhistory, and lifestyle or other factors, such as travel or where you work. What tests do I need?  Blood tests Lipid and cholesterol levels. These may be checked every 5 years starting at age 20. Hepatitis C test. Hepatitis B test. Screening Diabetes screening. This is done by checking your blood sugar (glucose) after you have not eaten for a while (fasting). STD (sexually transmitted disease) testing, if you are at risk. BRCA-related cancer screening. This may be done if you have a family history of breast, ovarian, tubal, or  peritoneal cancers. Pelvic exam and Pap test. This may be done every 3 years starting at age 21. Starting at age 30, this may be done every 5 years if you have a Pap test in combination with an HPV test. Talk with your health care provider about your test results, treatment options,and if necessary, the need for more tests. Follow these instructions at home: Eating and drinking  Eat a healthy diet that includes fresh fruits and vegetables, whole grains, lean protein, and low-fat dairy products. Take vitamin and mineral supplements as recommended by your health care provider. Do not drink alcohol if: Your health care provider tells you not to drink. You are pregnant, may be pregnant, or are planning to become pregnant. If you drink alcohol: Limit how much you have to 0-1 drink a day. Be aware of how much alcohol is in your drink. In the [place] one drink equals one 12 oz bottle of beer (355 mL), one 5 oz glass of wine (148 mL), or one 1 oz glass of hard liquor (44 mL).  Lifestyle Take daily care of your teeth and gums. Brush your teeth every morning and night with fluoride toothpaste. Floss one time each day. Stay active. Exercise for at least 30 minutes 5 or more days each week. Do not use any products that contain nicotine or tobacco, such as cigarettes, e-cigarettes, and chewing tobacco. If you need help quitting, ask your health care provider. Do not use drugs. If you are sexually active, practice safe sex. Use a condom or other form of protection to prevent STIs (sexually transmitted infections). If you do not wish to become pregnant, use a form of birth control. If you plan to become pregnant, see your health care   provider for a prepregnancy visit. Find healthy ways to cope with stress, such as: Meditation, yoga, or listening to music. Journaling. Talking to a trusted person. Spending time with friends and family. Safety Always wear your seat belt while driving or riding in a  vehicle. Do not drive: If you have been drinking alcohol. Do not ride with someone who has been drinking. When you are tired or distracted. While texting. Wear a helmet and other protective equipment during sports activities. If you have firearms in your house, make sure you follow all gun safety procedures. Seek help if you have been physically or sexually abused. What's next? Go to your health care provider once a year for an annual wellness visit. Ask your health care provider how often you should have your eyes and teeth checked. Stay up to date on all vaccines. This information is not intended to replace advice given to you by your health care provider. Make sure you discuss any questions you have with your healthcare provider. Document Revised: 05/26/2020 Document Reviewed: 06/09/2018 Elsevier Patient Education  2022 [employer].

## 2021-05-12 NOTE — Telephone Encounter (Signed)
Paperwork received on 04/29/2021. Completed and faxed the same day. Confirmation received.

## 2021-10-04 IMAGING — US US MFM FETAL BPP W/O NON-STRESS
1 series · 9 of 9 positions shown · non-contrast
Comparison: none

[Series 1: us mfm fetal bpp w/o non-stress · 9 acquisitions, 9 frames shown]
[im 1/9]
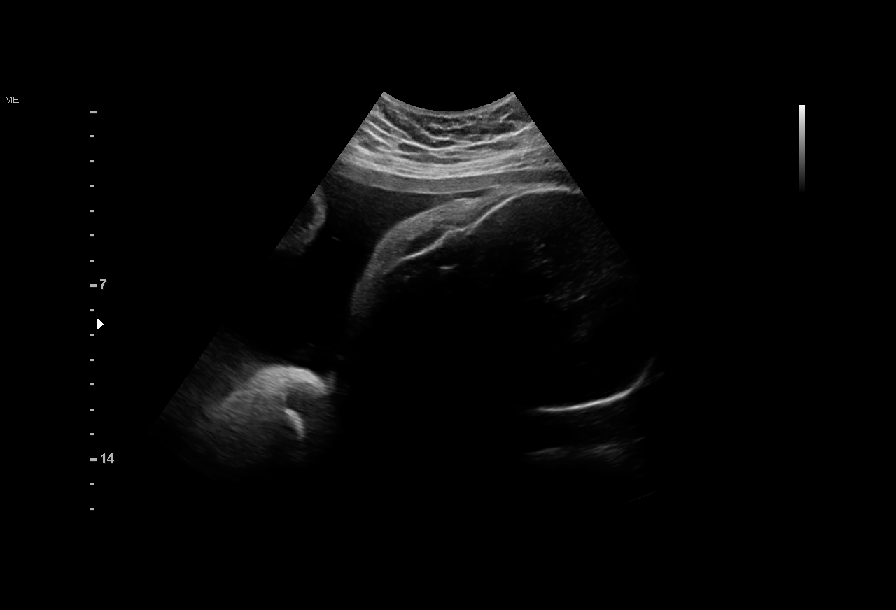
[im 2/9]
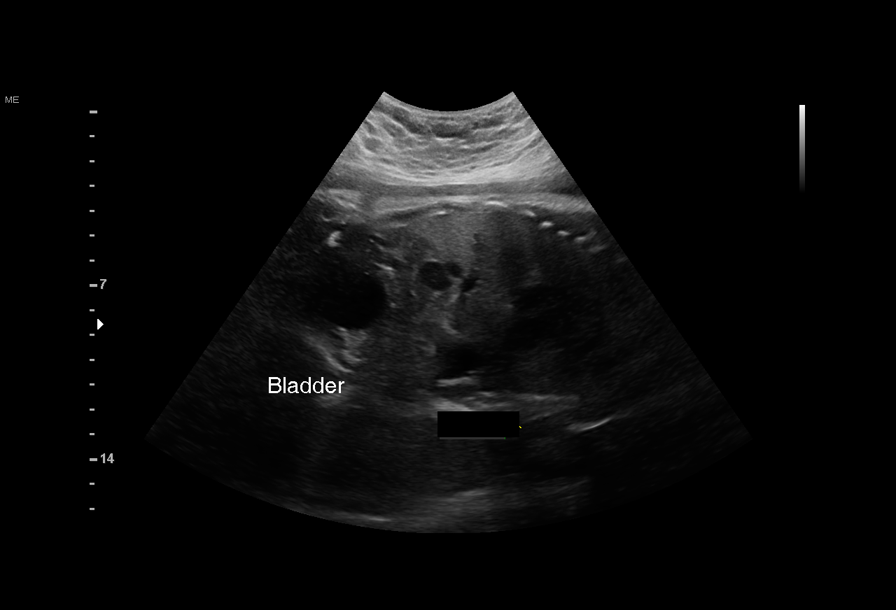
[im 3/9]
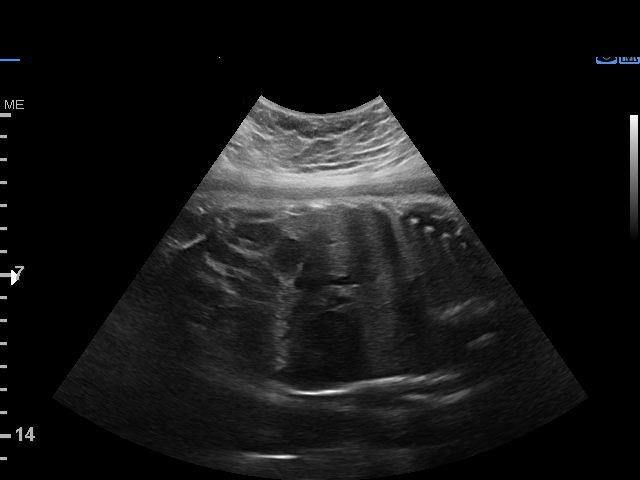
[im 4/9]
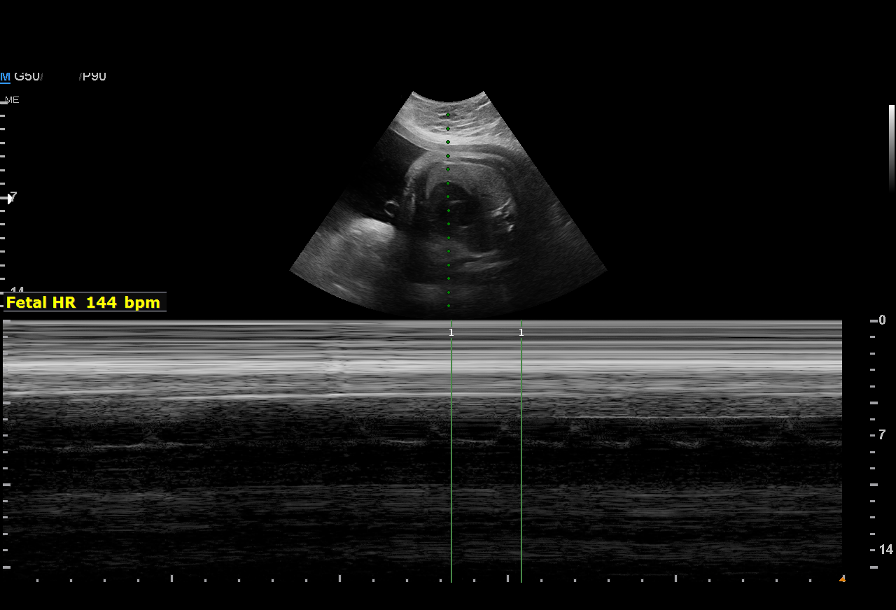
[im 5/9]
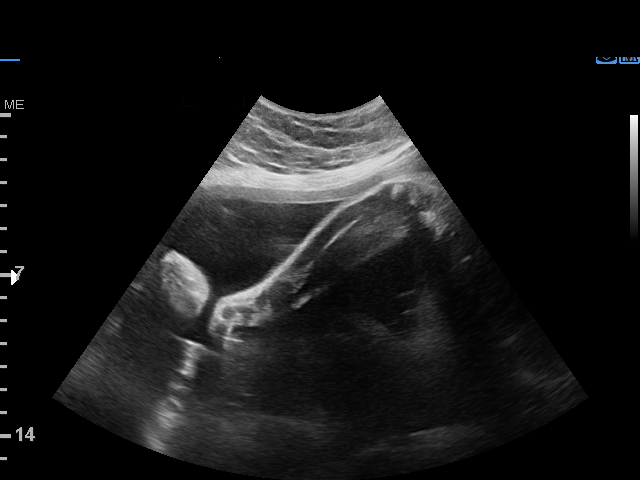
[im 6/9]
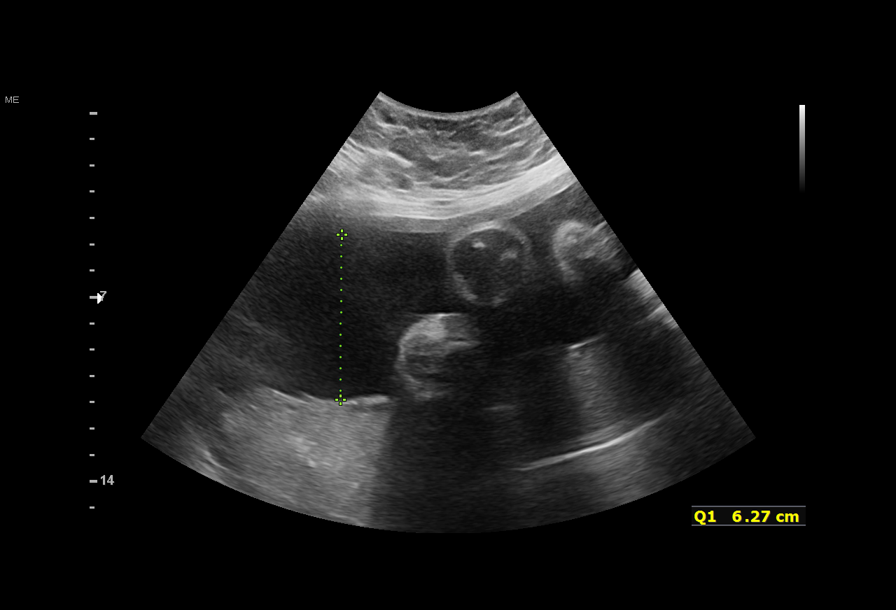
[im 7/9]
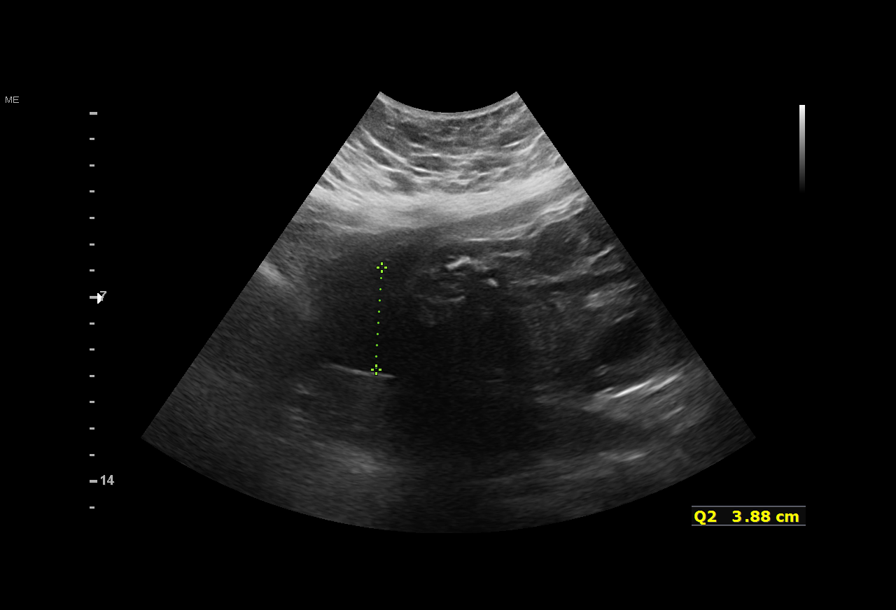
[im 8/9]
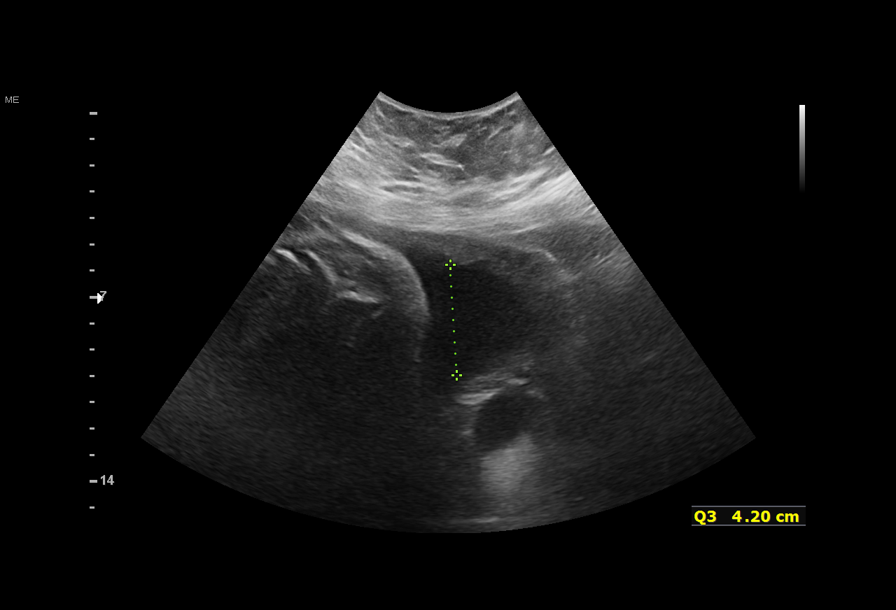
[im 9/9]
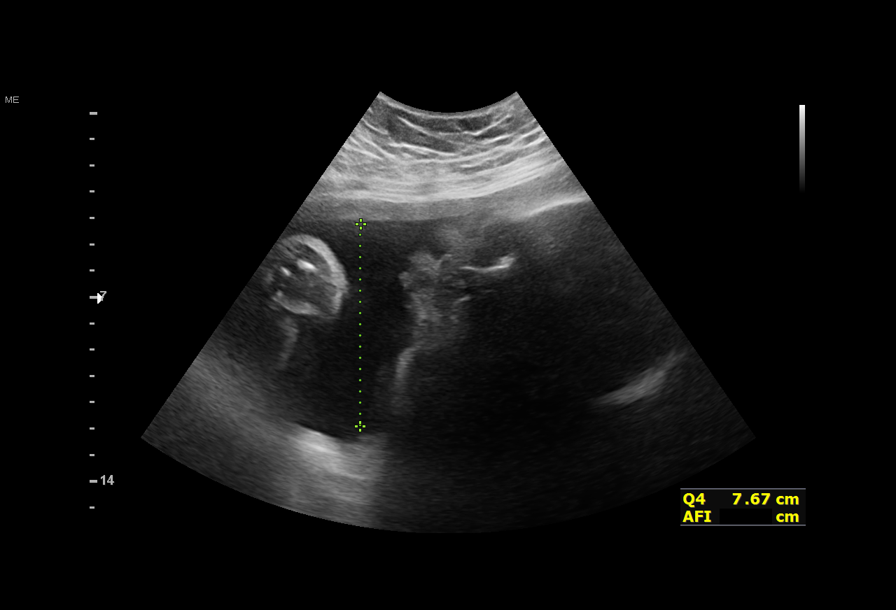

[9 of 9 positions shown; findings below may reference images not displayed]

[REDACTED]
                   SAPREEN

Indications

 36 weeks gestation of pregnancy
 Late to prenatal care, third trimester
 Obesity complicating pregnancy, third
 trimester
Fetal Evaluation

 Num Of Fetuses:         1
 Fetal Heart Rate(bpm):  144
 Cardiac Activity:       Observed
 Presentation:           Cephalic
 Placenta:               Posterior
 P. Cord Insertion:      Previously Visualized

 AFI Sum(cm)     %Tile       Largest Pocket(cm)
 22.02           84

 RUQ(cm)       RLQ(cm)       LUQ(cm)        LLQ(cm)

Biophysical Evaluation

 Amniotic F.V:   Within normal limits       F. Tone:        Observed
 F. Movement:    Observed                   Score:          [DATE]
 F. Breathing:   Observed
Gestational Age

 LMP:           33w 6d        Date:  06/21/20                 EDD:   03/28/21
 Best:          36w 1d     Det. By:  U/S  (01/28/21)          EDD:   03/12/21
Impression

 Antenatal testing performed given elevated maternal BMI.
 The biophysical profile was [DATE] with good fetal movement and
 amniotic fluid volume.
Recommendations

 Initiate weekly testing at 36 weeks.

## 2022-07-20 ENCOUNTER — Encounter: Payer: Self-pay | Admitting: Certified Nurse Midwife

## 2022-09-23 NOTE — Progress Notes (Unsigned)
    NURSE VISIT NOTE  Subjective:    Patient ID: Laurie Floyd, female    DOB: October 03, 1986, 36 y.o.   MRN: [id number]  HPI  Patient is a 36 y.o. [id number] female who presents  today for pregnancy confirmation. UPT resulted in ***. LMP ***. (*** if positive) She has been advised to schedule a NOB Nurse Interview at checkout.   Patient states no other questions or concerns.   The following portions of the patient's history were reviewed and updated as appropriate: allergies, current medications, past family history, past medical history, past social history, past surgical history, and problem list.  Review of Systems {ros; complete:[id number]}   Objective:   currently breastfeeding. There is no height or weight on file to calculate BMI.  General appearance: alert, cooperative, and no distress  Assessment:   No diagnosis found.   Plan:   There are no diagnoses linked to this encounter.    Hildred Laser, MD [facility]

## 2022-09-24 ENCOUNTER — Ambulatory Visit (INDEPENDENT_AMBULATORY_CARE_PROVIDER_SITE_OTHER): Payer: BC Managed Care – PPO

## 2022-09-24 ENCOUNTER — Other Ambulatory Visit: Payer: Self-pay | Admitting: Certified Nurse Midwife

## 2022-09-24 ENCOUNTER — Other Ambulatory Visit (HOSPITAL_COMMUNITY)
Admission: RE | Admit: 2022-09-24 | Discharge: 2022-09-24 | Disposition: A | Discharge status: Home / Self Care | Payer: BC Managed Care – PPO | Source: Ambulatory Visit | Attending: Certified Nurse Midwife | Admitting: Certified Nurse Midwife

## 2022-09-24 ENCOUNTER — Other Ambulatory Visit (INDEPENDENT_AMBULATORY_CARE_PROVIDER_SITE_OTHER): Payer: BC Managed Care – PPO

## 2022-09-24 VITALS — Wt 288.0 lb

## 2022-09-24 VITALS — BP 108/61 | HR 90 | Resp 16 | Ht 67.0 in | Wt 288.9 lb

## 2022-09-24 DIAGNOSIS — Z113 Encounter for screening for infections with a predominantly sexual mode of transmission: Secondary | ICD-10-CM | POA: Insufficient documentation

## 2022-09-24 DIAGNOSIS — Z3201 Encounter for pregnancy test, result positive: Secondary | ICD-10-CM

## 2022-09-24 DIAGNOSIS — Z3483 Encounter for supervision of other normal pregnancy, third trimester: Secondary | ICD-10-CM

## 2022-09-24 DIAGNOSIS — O0930 Supervision of pregnancy with insufficient antenatal care, unspecified trimester: Secondary | ICD-10-CM

## 2022-09-24 DIAGNOSIS — Z3A28 28 weeks gestation of pregnancy: Secondary | ICD-10-CM

## 2022-09-24 DIAGNOSIS — O0933 Supervision of pregnancy with insufficient antenatal care, third trimester: Secondary | ICD-10-CM

## 2022-09-24 DIAGNOSIS — Z369 Encounter for antenatal screening, unspecified: Secondary | ICD-10-CM

## 2022-09-24 DIAGNOSIS — Z0189 Encounter for other specified special examinations: Secondary | ICD-10-CM

## 2022-09-24 DIAGNOSIS — Z0283 Encounter for blood-alcohol and blood-drug test: Secondary | ICD-10-CM

## 2022-09-24 DIAGNOSIS — Z3A29 29 weeks gestation of pregnancy: Secondary | ICD-10-CM | POA: Diagnosis not present

## 2022-09-24 DIAGNOSIS — N912 Amenorrhea, unspecified: Secondary | ICD-10-CM

## 2022-09-24 DIAGNOSIS — Z3480 Encounter for supervision of other normal pregnancy, unspecified trimester: Secondary | ICD-10-CM | POA: Insufficient documentation

## 2022-09-24 LAB — POCT URINE PREGNANCY: Preg Test, Ur: POSITIVE — AB

## 2022-09-24 NOTE — Patient Instructions (Signed)
Third Trimester of Pregnancy  The third trimester of pregnancy is from week 28 through week 70. This is months 7 through 9. The third trimester is a time when the unborn baby (fetus) is growing rapidly. At the end of the ninth month, the fetus is about 20 inches long and weighs 6-10 pounds. Body changes during your third trimester During the third trimester, your body will continue to go through many changes. The changes vary and generally return to normal after your baby is born. Physical changes Your weight will continue to increase. You can expect to gain 25-35 pounds (11-16 kg) by the end of the pregnancy if you begin pregnancy at a normal weight. If you are underweight, you can expect to gain 28-40 lb (about 13-18 kg), and if you are overweight, you can expect to gain 15-25 lb (about 7-11 kg). You may begin to get stretch marks on your hips, abdomen, and breasts. Your breasts will continue to grow and may hurt. A yellow fluid (colostrum) may leak from your breasts. This is the first milk you are producing for your baby. You may have changes in your hair. These can include thickening of your hair, rapid growth, and changes in texture. Some people also have hair loss during or after pregnancy, or hair that feels dry or thin. Your belly button may stick out. You may notice more swelling in your hands, face, or ankles. Health changes You may have heartburn. You may have constipation. You may develop hemorrhoids. You may develop swollen, bulging veins (varicose veins) in your legs. You may have increased body aches in the pelvis, back, or thighs. This is due to weight gain and increased hormones that are relaxing your joints. You may have increased tingling or numbness in your hands, arms, and legs. The skin on your abdomen may also feel numb. You may feel short of breath because of your expanding uterus. Other changes You may urinate more often because the fetus is moving lower into your pelvis  and pressing on your bladder. You may have more problems sleeping. This may be caused by the size of your abdomen, an increased need to urinate, and an increase in your body's metabolism. You may notice the fetus "dropping," or moving lower in your abdomen (lightening). You may have increased vaginal discharge. You may notice that you have pain around your pelvic bone as your uterus distends. Follow these instructions at home: Medicines Follow your health care provider's instructions regarding medicine use. Specific medicines may be either safe or unsafe to take during pregnancy. Do not take any medicines unless approved by your health care provider. Take a prenatal vitamin that contains at least 600 micrograms (mcg) of folic acid. Eating and drinking Eat a healthy diet that includes fresh fruits and vegetables, whole grains, good sources of protein such as meat, eggs, or tofu, and low-fat dairy products. Avoid raw meat and unpasteurized juice, milk, and cheese. These carry germs that can harm you and your baby. Eat 4 or 5 small meals rather than 3 large meals a day. You may need to take these actions to prevent or treat constipation: Drink enough fluid to keep your urine pale yellow. Eat foods that are high in fiber, such as beans, whole grains, and fresh fruits and vegetables. Limit foods that are high in fat and processed sugars, such as fried or sweet foods. Activity Exercise only as directed by your health care provider. Most people can continue their usual exercise routine during pregnancy. Try  to exercise for 30 minutes at least 5 days a week. Stop exercising if you experience contractions in the uterus. Stop exercising if you develop pain or cramping in the lower abdomen or lower back. Avoid heavy lifting. Do not exercise if it is very hot or humid or if you are at a high altitude. If you choose to, you may continue to have sex unless your health care provider tells you not  to. Relieving pain and discomfort Take frequent breaks and rest with your legs raised (elevated) if you have leg cramps or low back pain. Take warm sitz baths to soothe any pain or discomfort caused by hemorrhoids. Use hemorrhoid cream if your health care provider approves. Wear a supportive bra to prevent discomfort from breast tenderness. If you develop varicose veins: Wear support hose as told by your health care provider. Elevate your feet for 15 minutes, 3-4 times a day. Limit salt in your diet. Safety Talk to your health care provider before traveling far distances. Do not use hot tubs, steam rooms, or saunas. Wear your seat belt at all times when driving or riding in a car. Talk with your health care provider if someone is verbally or physically abusive to you. Preparing for birth To prepare for the arrival of your baby: Take prenatal classes to understand, practice, and ask questions about labor and delivery. Visit the hospital and tour the maternity area. Purchase a rear-facing car seat and make sure you know how to install it in your car. Prepare the baby's room or sleeping area. Make sure to remove all pillows and stuffed animals from the baby's crib to prevent suffocation. General instructions Avoid cat litter boxes and soil used by cats. These carry germs that can cause birth defects in the baby. If you have a cat, ask someone to clean the litter box for you. Do not douche or use tampons. Do not use scented sanitary pads. Do not use any products that contain nicotine or tobacco, such as cigarettes, e-cigarettes, and chewing tobacco. If you need help quitting, ask your health care provider. Do not use any herbal remedies, illegal drugs, or medicines that were not prescribed to you. Chemicals in these products can harm your baby. Do not drink alcohol. You will have more frequent prenatal exams during the third trimester. During a routine prenatal visit, your health care provider  will do a physical exam, perform tests, and discuss your overall health. Keep all follow-up visits. This is important. Where to find more information American Pregnancy Association: americanpregnancy.[place] and Gynecologists: PoolDevices.com.pt Office on [employer]: KeywordPortfolios.com.br Contact a health care provider if you have: A fever. Mild pelvic cramps, pelvic pressure, or nagging pain in your abdominal area or lower back. Vomiting or diarrhea. Bad-smelling vaginal discharge or foul-smelling urine. Pain when you urinate. A headache that does not go away when you take medicine. Visual changes or see spots in front of your eyes. Get help right away if: Your water breaks. You have regular contractions less than 5 minutes apart. You have spotting or bleeding from your vagina. You have severe abdominal pain. You have difficulty breathing. You have chest pain. You have fainting spells. You have not felt your baby move for the time period told by your health care provider. You have new or increased pain, swelling, or redness in an arm or leg. Summary The third trimester of pregnancy is from week 28 through week 40 (months 7 through 9). You may have more problems  sleeping. This can be caused by the size of your abdomen, an increased need to urinate, and an increase in your body's metabolism. You will have more frequent prenatal exams during the third trimester. Keep all follow-up visits. This is important. This information is not intended to replace advice given to you by your health care provider. Make sure you discuss any questions you have with your health care provider. Document Revised: 03/06/2020 Document Reviewed: 01/11/2020 Elsevier Patient Education  [place]. Commonly Asked Questions During Pregnancy  Cats: A parasite can be excreted in cat feces.  To avoid exposure you need to have another person empty the  little box.  If you must empty the litter box you will need to wear gloves.  Wash your hands after handling your cat.  This parasite can also be found in raw or undercooked meat so this should also be avoided.  Colds, Sore Throats, Flu: Please check your medication sheet to see what you can take for symptoms.  If your symptoms are unrelieved by these medications please call the office.  Dental Work: Most any dental work Investment banker, corporate recommends is permitted.  X-rays should only be taken during the first trimester if absolutely necessary.  Your abdomen should be shielded with a lead apron during all x-rays.  Please notify your provider prior to receiving any x-rays.  Novocaine is fine; gas is not recommended.  If your dentist requires a note from [place] prior to dental work please call the office and we will provide one for you.  Exercise: Exercise is an important part of staying healthy during your pregnancy.  You may continue most exercises you were accustomed to prior to pregnancy.  Later in your pregnancy you will most likely notice you have difficulty with activities requiring balance like riding a bicycle.  It is important that you listen to your body and avoid activities that put you at a higher risk of falling.  Adequate rest and staying well hydrated are a must!  If you have questions about the safety of specific activities ask your provider.    Exposure to Children with illness: Try to avoid obvious exposure; report any symptoms to [place] when noted,  If you have chicken pos, red measles or mumps, you should be immune to these diseases.   Please do not take any vaccines while pregnant unless you have checked with your OB provider.  Fetal Movement: After 28 weeks we recommend you do "kick counts" twice daily.  Lie or sit down in a calm quiet environment and count your baby movements "kicks".  You should feel your baby at least 10 times per hour.  If you have not felt 10 kicks within the first hour get up,  walk around and have something sweet to eat or drink then repeat for an additional hour.  If count remains less than 10 per hour notify your provider.  Fumigating: Follow your pest control agent's advice as to how long to stay out of your home.  Ventilate the area well before re-entering.  Hemorrhoids:   Most over-the-counter preparations can be used during pregnancy.  Check your medication to see what is safe to use.  It is important to use a stool softener or fiber in your diet and to drink lots of liquids.  If hemorrhoids seem to be getting worse please call the office.   Hot Tubs:  Hot tubs Jacuzzis and saunas are not recommended while pregnant.  These increase your internal body temperature and  should be avoided.  Intercourse:  Sexual intercourse is safe during pregnancy as long as you are comfortable, unless otherwise advised by your provider.  Spotting may occur after intercourse; report any bright red bleeding that is heavier than spotting.  Labor:  If you know that you are in labor, please go to the hospital.  If you are unsure, please call the office and let us help you decide what to do.  Lifting, straining, etc:  If your job requires heavy lifting or straining please check with your provider for any limitations.  Generally, you should not lift items heavier than that you can lift simply with your hands and arms (no back muscles)  Painting:  Paint fumes do not harm your pregnancy, but may make you ill and should be avoided if possible.  Latex or water based paints have less odor than oils.  Use adequate ventilation while painting.  Permanents & Hair Color:  Chemicals in hair dyes are not recommended as they cause increase hair dryness which can increase hair loss during pregnancy.  " Highlighting" and permanents are allowed.  Dye may be absorbed differently and permanents may not hold as well during pregnancy.  Sunbathing:  Use a sunscreen, as skin burns easily during pregnancy.  Drink  plenty of fluids; avoid over heating.  Tanning Beds:  Because their possible side effects are still unknown, tanning beds are not recommended.  Ultrasound Scans:  Routine ultrasounds are performed at approximately 20 weeks.  You will be able to see your baby's general anatomy an if you would like to know the gender this can usually be determined as well.  If it is questionable when you conceived you may also receive an ultrasound early in your pregnancy for dating purposes.  Otherwise ultrasound exams are not routinely performed unless there is a medical necessity.  Although you can request a scan we ask that you pay for it when conducted because insurance does not cover " patient request" scans.  Work: If your pregnancy proceeds without complications you may work until your due date, unless your physician or employer advises otherwise.  Round Ligament Pain/Pelvic Discomfort:  Sharp, shooting pains not associated with bleeding are fairly common, usually occurring in the second trimester of pregnancy.  They tend to be worse when standing up or when you remain standing for long periods of time.  These are the result of pressure of certain pelvic ligaments called "round ligaments".  Rest, Tylenol and heat seem to be the most effective relief.  As the womb and fetus grow, they rise out of the pelvis and the discomfort improves.  Please notify the office if your pain seems different than that described.  It may represent a more serious condition.  Common Medications Safe in Pregnancy  Acne:      Constipation:  Benzoyl Peroxide     Colace  Clindamycin      Dulcolax Suppository  Topica Erythromycin     Fibercon  Salicylic Acid      Metamucil         Miralax AVOID:        Senakot   Accutane    Cough:  Retin-A       Cough Drops  Tetracycline      Phenergan w/ Codeine if Rx  Minocycline      Robitussin (Plain &  DM)  Antibiotics:     Crabs/Lice:  Ceclor       RID  Cephalosporins    AVOID:  E-Mycins  Kwell  Keflex  Macrobid/Macrodantin   Diarrhea:  Penicillin      Kao-Pectate  Zithromax      Imodium AD         PUSH FLUIDS AVOID:       Cipro     Fever:  Tetracycline      Tylenol (Regular or Extra  Minocycline       Strength)  Levaquin      Extra Strength-Do not          Exceed 8 tabs/24 hrs Caffeine:        <279m/day (equiv. To 1 cup of coffee or  approx. 3 12 oz sodas)         Gas: Cold/Hayfever:       Gas-X  Benadryl      Mylicon  Claritin       Phazyme  **Claritin-D        Chlor-Trimeton    Headaches:  Dimetapp      ASA-Free Excedrin  Drixoral-Non-Drowsy     Cold Compress  Mucinex (Guaifenasin)     Tylenol (Regular or Extra  Sudafed/Sudafed-12 Hour     Strength)  **Sudafed PE Pseudoephedrine   Tylenol Cold & Sinus     Vicks Vapor Rub  Zyrtec  **AVOID if Problems With Blood Pressure         Heartburn: Avoid lying down for at least 1 hour after meals  Aciphex      Maalox     Rash:  Milk of Magnesia     Benadryl    Mylanta       1% Hydrocortisone Cream  Pepcid  Pepcid Complete   Sleep Aids:  Prevacid      Ambien   Prilosec       Benadryl  Rolaids       Chamomile Tea  Tums (Limit 4/day)     Unisom         Tylenol PM         Warm milk-add vanilla or  Hemorrhoids:       Sugar for taste  Anusol/Anusol H.C.  (RX: Analapram 2.5%)  Sugar Substitutes:  Hydrocortisone OTC     Ok in moderation  Preparation H      Tucks        Vaseline lotion applied to tissue with wiping    Herpes:     Throat:  Acyclovir      Oragel  Famvir  Valtrex     Vaccines:         Flu Shot Leg Cramps:       *Gardasil  Benadryl      Hepatitis A         Hepatitis B Nasal Spray:       Pneumovax  Saline Nasal Spray     Polio Booster         Tetanus Nausea:       Tuberculosis test or PPD  Vitamin B6 25 mg TID   AVOID:    Dramamine      *Gardasil  Emetrol       Live Poliovirus  Ginger  Root 250 mg QID    MMR (measles, mumps &  High Complex Carbs @ Bedtime    rebella)  Sea Bands-Accupressure    Varicella (Chickenpox)  Unisom 1/2 tab TID     *No known complications           If received before Pain:         Known pregnancy;  Darvocet       Resume series after  Lortab        Delivery  Percocet    Yeast:   Tramadol      Femstat  Tylenol 3      Gyne-lotrimin  Ultram       Monistat  Vicodin           MISC:         All Sunscreens           Hair Coloring/highlights          Insect Repellant's          (Including DEET)         Mystic Tans

## 2022-09-24 NOTE — Patient Instructions (Signed)
Commonly Asked Questions During Pregnancy  Cats: A parasite can be excreted in cat feces.  To avoid exposure you need to have another person empty the little box.  If you must empty the litter box you will need to wear gloves.  Wash your hands after handling your cat.  This parasite can also be found in raw or undercooked meat so this should also be avoided.  Colds, Sore Throats, Flu: Please check your medication sheet to see what you can take for symptoms.  If your symptoms are unrelieved by these medications please call the office.  Dental Work: Most any dental work your dentist recommends is permitted.  X-rays should only be taken during the first trimester if absolutely necessary.  Your abdomen should be shielded with a lead apron during all x-rays.  Please notify your provider prior to receiving any x-rays.  Novocaine is fine; gas is not recommended.  If your dentist requires a note from us prior to dental work please call the office and we will provide one for you.  Exercise: Exercise is an important part of staying healthy during your pregnancy.  You may continue most exercises you were accustomed to prior to pregnancy.  Later in your pregnancy you will most likely notice you have difficulty with activities requiring balance like riding a bicycle.  It is important that you listen to your body and avoid activities that put you at a higher risk of falling.  Adequate rest and staying well hydrated are a must!  If you have questions about the safety of specific activities ask your provider.    Exposure to Children with illness: Try to avoid obvious exposure; report any symptoms to us when noted,  If you have chicken pos, red measles or mumps, you should be immune to these diseases.   Please do not take any vaccines while pregnant unless you have checked with your OB provider.  Fetal Movement: After 28 weeks we recommend you do "kick counts" twice daily.  Lie or sit down in a calm quiet environment and  count your baby movements "kicks".  You should feel your baby at least 10 times per hour.  If you have not felt 10 kicks within the first hour get up, walk around and have something sweet to eat or drink then repeat for an additional hour.  If count remains less than 10 per hour notify your provider.  Fumigating: Follow your pest control agent's advice as to how long to stay out of your home.  Ventilate the area well before re-entering.  Hemorrhoids:   Most over-the-counter preparations can be used during pregnancy.  Check your medication to see what is safe to use.  It is important to use a stool softener or fiber in your diet and to drink lots of liquids.  If hemorrhoids seem to be getting worse please call the office.   Hot Tubs:  Hot tubs Jacuzzis and saunas are not recommended while pregnant.  These increase your internal body temperature and should be avoided.  Intercourse:  Sexual intercourse is safe during pregnancy as long as you are comfortable, unless otherwise advised by your provider.  Spotting may occur after intercourse; report any bright red bleeding that is heavier than spotting.  Labor:  If you know that you are in labor, please go to the hospital.  If you are unsure, please call the office and let us help you decide what to do.  Lifting, straining, etc:  If your job requires heavy   lifting or straining please check with your provider for any limitations.  Generally, you should not lift items heavier than that you can lift simply with your hands and arms (no back muscles)  Painting:  Paint fumes do not harm your pregnancy, but may make you ill and should be avoided if possible.  Latex or water based paints have less odor than oils.  Use adequate ventilation while painting.  Permanents & Hair Color:  Chemicals in hair dyes are not recommended as they cause increase hair dryness which can increase hair loss during pregnancy.  " Highlighting" and permanents are allowed.  Dye may be  absorbed differently and permanents may not hold as well during pregnancy.  Sunbathing:  Use a sunscreen, as skin burns easily during pregnancy.  Drink plenty of fluids; avoid over heating.  Tanning Beds:  Because their possible side effects are still unknown, tanning beds are not recommended.  Ultrasound Scans:  Routine ultrasounds are performed at approximately 20 weeks.  You will be able to see your baby's general anatomy an if you would like to know the gender this can usually be determined as well.  If it is questionable when you conceived you may also receive an ultrasound early in your pregnancy for dating purposes.  Otherwise ultrasound exams are not routinely performed unless there is a medical necessity.  Although you can request a scan we ask that you pay for it when conducted because insurance does not cover " patient request" scans.  Work: If your pregnancy proceeds without complications you may work until your due date, unless your physician or employer advises otherwise.  Round Ligament Pain/Pelvic Discomfort:  Sharp, shooting pains not associated with bleeding are fairly common, usually occurring in the second trimester of pregnancy.  They tend to be worse when standing up or when you remain standing for long periods of time.  These are the result of pressure of certain pelvic ligaments called "round ligaments".  Rest, Tylenol and heat seem to be the most effective relief.  As the womb and fetus grow, they rise out of the pelvis and the discomfort improves.  Please notify the office if your pain seems different than that described.  It may represent a more serious condition.  Morning Sickness  Morning sickness is when you feel like you may vomit (feel nauseous) during pregnancy. Sometimes, you may vomit. Morning sickness most often happens in the morning, but it can also happen at any time of the day. Some women may have morning sickness that makes them vomit all the time. This is a  more serious problem that needs treatment. What are the causes? The cause of this condition is not known. What increases the risk? You had vomiting or a feeling like you may vomit before your pregnancy. You had morning sickness in another pregnancy. You are pregnant with more than one baby, such as twins. What are the signs or symptoms? Feeling like you may vomit. Vomiting. How is this treated? Treatment is usually not needed for this condition. You may only need to change what you eat. In some cases, your doctor may give you some things to take for your condition. These include: Vitamin B6 supplements. Medicines to treat the feeling that you may vomit. Ginger. Follow these instructions at home: Medicines Take over-the-counter and prescription medicines only as told by your doctor. Do not take any medicines until you talk with your doctor about them first. Take multivitamins before you get pregnant. These can stop or  lessen the symptoms of morning sickness. Eating and drinking Eat dry toast or crackers before getting out of bed. Eat 5 or 6 small meals a day. Eat dry and bland foods like rice and baked potatoes. Do not eat greasy, fatty, or spicy foods. Have someone cook for you if the smell of food causes you to vomit or to feel like you may vomit. If you feel like you may vomit after taking prenatal vitamins, take them at night or with a snack. Eat protein foods when you need a snack. Nuts, yogurt, and cheese are good choices. Drink fluids throughout the day. Try ginger ale made with real ginger, ginger tea made from fresh grated ginger, or ginger candies. General instructions Do not smoke or use any products that contain nicotine or tobacco. If you need help quitting, ask your doctor. Use an air purifier to keep the air in your house free of smells. Get lots of fresh air. Try to avoid smells that make you feel sick. Try wearing an acupressure wristband. This is a wristband that is  used to treat seasickness. Try a treatment called acupuncture. In this treatment, a doctor puts needles into certain areas of your body to make you feel better. Contact a doctor if: You need medicine to feel better. You feel dizzy or light-headed. You are losing weight. Get help right away if: The feeling that you may vomit will not go away, or you cannot stop vomiting. You faint. You have very bad pain in your belly. Summary Morning sickness is when you feel like you may vomit (feel nauseous) during pregnancy. You may feel sick in the morning, but you can feel this way at any time of the day. Making some changes to what you eat may help your symptoms go away. This information is not intended to replace advice given to you by your health care provider. Make sure you discuss any questions you have with your health care provider. Document Revised: 05/13/2020 Document Reviewed: 04/22/2020 Elsevier Patient Education  [place] of Pregnancy  The first trimester of pregnancy starts on the first day of your last menstrual period until the end of week 12. This is also called months 1 through 3 of pregnancy. Body changes during your first trimester Your body goes through many changes during pregnancy. The changes usually return to normal after your baby is born. Physical changes You may gain or lose weight. Your breasts may grow larger and hurt. The area around your nipples may get darker. Dark spots or blotches may develop on your face. You may have changes in your hair. Health changes You may feel like you might vomit (nauseous), and you may vomit. You may have heartburn. You may have headaches. You may have trouble pooping (constipation). Your gums may bleed. Other changes You may get tired easily. You may pee (urinate) more often. Your menstrual periods will stop. You may not feel hungry. You may want to eat certain kinds of food. You may have changes in  your emotions from day to day. You may have more dreams. Follow these instructions at home: Medicines Take over-the-counter and prescription medicines only as told by your doctor. Some medicines are not safe during pregnancy. Take a prenatal vitamin that contains at least 600 micrograms (mcg) of folic acid. Eating and drinking Eat healthy meals that include: Fresh fruits and vegetables. Whole grains. Good sources of protein, such as meat, eggs, or tofu. Low-fat dairy products. Avoid raw meat and unpasteurized  juice, milk, and cheese. If you feel like you may vomit, or you vomit: Eat 4 or 5 small meals a day instead of 3 large meals. Try eating a few soda crackers. Drink liquids between meals instead of during meals. You may need to take these actions to prevent or treat trouble pooping: Drink enough fluids to keep your pee (urine) pale yellow. Eat foods that are high in fiber. These include beans, whole grains, and fresh fruits and vegetables. Limit foods that are high in fat and sugar. These include fried or sweet foods. Activity Exercise only as told by your doctor. Most people can do their usual exercise routine during pregnancy. Stop exercising if you have cramps or pain in your lower belly (abdomen) or low back. Do not exercise if it is too hot or too humid, or if you are in a place of great height (high altitude). Avoid heavy lifting. If you choose to, you may have sex unless your doctor tells you not to. Relieving pain and discomfort Wear a good support bra if your breasts are sore. Rest with your legs raised (elevated) if you have leg cramps or low back pain. If you have bulging veins (varicose veins) in your legs: Wear support hose as told by your doctor. Raise your feet for 15 minutes, 3-4 times a day. Limit salt in your food. Safety Wear your seat belt at all times when you are in a car. Talk with your doctor if someone is hurting you or yelling at you. Talk with your  doctor if you are feeling sad or have thoughts of hurting yourself. Lifestyle Do not use hot tubs, steam rooms, or saunas. Do not douche. Do not use tampons or scented sanitary pads. Do not use herbal medicines, illegal drugs, or medicines that are not approved by your doctor. Do not drink alcohol. Do not smoke or use any products that contain nicotine or tobacco. If you need help quitting, ask your doctor. Avoid cat litter boxes and soil that is used by cats. These carry germs that can cause harm to the baby and can cause a loss of your baby by miscarriage or stillbirth. General instructions Keep all follow-up visits. This is important. Ask for help if you need counseling or if you need help with nutrition. Your doctor can give you advice or tell you where to go for help. Visit your dentist. At home, brush your teeth with a soft toothbrush. Floss gently. Write down your questions. Take them to your prenatal visits. Where to find more information American Pregnancy Association: americanpregnancy.org [employer]: www.acog.org Office on Women's Health: KeywordPortfolios.com.br Contact a doctor if: You are dizzy. You have a fever. You have mild cramps or pressure in your lower belly. You have a nagging pain in your belly area. You continue to feel like you may vomit, you vomit, or you have watery poop (diarrhea) for 24 hours or longer. You have a bad-smelling fluid coming from your vagina. You have pain when you pee. You are exposed to a disease that spreads from person to person, such as chickenpox, measles, Zika virus, HIV, or hepatitis. Get help right away if: You have spotting or bleeding from your vagina. You have very bad belly cramping or pain. You have shortness of breath or chest pain. You have any kind of injury, such as from a fall or a car crash. You have new or increased pain, swelling, or redness in an arm or leg. Summary The first  trimester of pregnancy starts on the first day of your last menstrual period until the end of week 12 (months 1 through 3). Eat 4 or 5 small meals a day instead of 3 large meals. Do not smoke or use any products that contain nicotine or tobacco. If you need help quitting, ask your doctor. Keep all follow-up visits. This information is not intended to replace advice given to you by your health care provider. Make sure you discuss any questions you have with your health care provider. Document Revised: 03/06/2020 Document Reviewed: 01/11/2020 Elsevier Patient Education  [place].    Common Medications Safe in Pregnancy  Acne:      Constipation:  Benzoyl Peroxide     Colace  Clindamycin      Dulcolax Suppository  Topica Erythromycin     Fibercon  Salicylic Acid      Metamucil         Miralax AVOID:        Senakot   Accutane    Cough:  Retin-A       Cough Drops  Tetracycline      Phenergan w/ Codeine if Rx  Minocycline      Robitussin (Plain & DM)  Antibiotics:     Crabs/Lice:  Ceclor       RID  Cephalosporins    AVOID:  E-Mycins      Kwell  Keflex  Macrobid/Macrodantin   Diarrhea:  Penicillin      Kao-Pectate  Zithromax      Imodium AD         PUSH FLUIDS AVOID:       Cipro     Fever:  Tetracycline      Tylenol (Regular or Extra  Minocycline       Strength)  Levaquin      Extra Strength-Do not          Exceed 8 tabs/24 hrs Caffeine:        <266m/day (equiv. To 1 cup of coffee or  approx. 3 12 oz sodas)         Gas: Cold/Hayfever:       Gas-X  Benadryl      Mylicon  Claritin       Phazyme  **Claritin-D        Chlor-Trimeton    Headaches:  Dimetapp      ASA-Free Excedrin  Drixoral-Non-Drowsy     Cold Compress  Mucinex (Guaifenasin)     Tylenol (Regular or Extra  Sudafed/Sudafed-12 Hour     Strength)  **Sudafed PE Pseudoephedrine   Tylenol Cold & Sinus     Vicks Vapor Rub  Zyrtec  **AVOID if Problems With Blood Pressure         Heartburn: Avoid  lying down for at least 1 hour after meals  Aciphex      Maalox     Rash:  Milk of Magnesia     Benadryl    Mylanta       1% Hydrocortisone Cream  Pepcid  Pepcid Complete   Sleep Aids:  Prevacid      Ambien   Prilosec       Benadryl  Rolaids       Chamomile Tea  Tums (Limit 4/day)     Unisom         Tylenol PM         Warm milk-add vanilla or  Hemorrhoids:       Sugar for taste  Anusol/Anusol H.C.  (  RX: Analapram 2.5%)  Sugar Substitutes:  Hydrocortisone OTC     Ok in moderation  Preparation H      Tucks        Vaseline lotion applied to tissue with wiping    Herpes:     Throat:  Acyclovir      Oragel  Famvir  Valtrex     Vaccines:         Flu Shot Leg Cramps:       *Gardasil  Benadryl      Hepatitis A         Hepatitis B Nasal Spray:       Pneumovax  Saline Nasal Spray     Polio Booster         Tetanus Nausea:       Tuberculosis test or PPD  Vitamin B6 25 mg TID   AVOID:    Dramamine      *Gardasil  Emetrol       Live Poliovirus  Ginger Root 250 mg QID    MMR (measles, mumps &  High Complex Carbs @ Bedtime    rebella)  Sea Bands-Accupressure    Varicella (Chickenpox)  Unisom 1/2 tab TID     *No known complications           If received before Pain:         Known pregnancy;   Darvocet       Resume series after  Lortab        Delivery  Percocet    Yeast:   Tramadol      Femstat  Tylenol 3      Gyne-lotrimin  Ultram       Monistat  Vicodin           MISC:         All Sunscreens           Hair Coloring/highlights          Insect Repellant's          (Including DEET)         Mystic Tans

## 2022-09-24 NOTE — Progress Notes (Signed)
New OB Intake  I connected with  Laurie Floyd on 09/24/22 at  2:15 PM EST by telephone and verified that I am speaking with the correct person using two identifiers. Nurse is located at [facility] and pt is located at home.  I explained I am completing New OB Intake today. We discussed her EDD of 12/14/2022 that is based on LMP of 03/09/2022. Pt is [id number]/P6016. I reviewed her allergies, medications, Medical/Surgical/OB history, and appropriate screenings. Based on history, this is a/an pregnancy uncomplicated .   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  She likes for everything to be natural; states doesn't care about test results; just likes to keep check on her blood pressure.   Delivery Plans:  Plans to deliver at home again.  Anatomy US Explained first scheduled [place] will be hopefully today if [employer] can work her in as pt is 28wks.  Labs Discussed genetic screening with patient. Patient declines genetic testing . Discussed possible labs to be drawn at new OB appointment.  COVID Vaccine Patient has not had COVID vaccine.   Social Determinants of Health Food Insecurity: denies food insecurity Transportation: Patient denies transportation needs.  First visit review I reviewed new OB appt with pt. I explained she will have ob bloodwork and pap smear/pelvic exam if indicated. Explained pt will be seen by Dr. Nicholaus Bloom at  first visit; encounter routed to appropriate provider.   Loran Senters, [place] 09/24/2022  2:13 PM

## 2022-09-25 ENCOUNTER — Encounter: Payer: Self-pay | Admitting: Obstetrics & Gynecology

## 2022-09-25 LAB — URINALYSIS, ROUTINE W REFLEX MICROSCOPIC
Bilirubin, UA: NEGATIVE
Glucose, UA: NEGATIVE
Leukocytes,UA: NEGATIVE
Nitrite, UA: NEGATIVE
RBC, UA: NEGATIVE
Specific Gravity, UA: 1.018 (ref 1.005–1.030)
Urobilinogen, Ur: 0.2 mg/dL (ref 0.2–1.0)
pH, UA: 7 (ref 5.0–7.5)

## 2022-09-26 LAB — PAIN MGT SCRN (14 DRUGS), UR
Amphetamine Scrn, Ur: NEGATIVE ng/mL
BARBITURATE SCREEN URINE: NEGATIVE ng/mL
BENZODIAZEPINE SCREEN, URINE: NEGATIVE ng/mL
Buprenorphine, Urine: NEGATIVE ng/mL
CANNABINOIDS UR QL SCN: NEGATIVE ng/mL
Cocaine (Metab) Scrn, Ur: NEGATIVE ng/mL
Creatinine(Crt), U: 159.3 mg/dL (ref 20.0–300.0)
Fentanyl, Urine: NEGATIVE pg/mL
Meperidine Screen, Urine: NEGATIVE ng/mL
Methadone Screen, Urine: NEGATIVE ng/mL
OXYCODONE+OXYMORPHONE UR QL SCN: NEGATIVE ng/mL
Opiate Scrn, Ur: NEGATIVE ng/mL
Ph of Urine: 7 (ref 4.5–8.9)
Phencyclidine Qn, Ur: NEGATIVE ng/mL
Propoxyphene Scrn, Ur: NEGATIVE ng/mL
Tramadol Screen, Urine: NEGATIVE ng/mL

## 2022-09-26 LAB — NICOTINE SCREEN, URINE: Cotinine Ql Scrn, Ur: NEGATIVE ng/mL

## 2022-09-26 LAB — URINE CULTURE, OB REFLEX

## 2022-09-26 LAB — CULTURE, OB URINE

## 2022-09-28 ENCOUNTER — Other Ambulatory Visit (HOSPITAL_COMMUNITY): Payer: Self-pay | Admitting: Obstetrics & Gynecology

## 2022-09-28 ENCOUNTER — Other Ambulatory Visit (HOSPITAL_COMMUNITY)
Admission: RE | Admit: 2022-09-28 | Discharge: 2022-09-28 | Disposition: A | Discharge status: Home / Self Care | Payer: BC Managed Care – PPO | Source: Ambulatory Visit | Attending: Obstetrics & Gynecology | Admitting: Obstetrics & Gynecology

## 2022-09-28 ENCOUNTER — Other Ambulatory Visit: Payer: 59

## 2022-09-28 ENCOUNTER — Other Ambulatory Visit: Payer: BC Managed Care – PPO

## 2022-09-28 ENCOUNTER — Ambulatory Visit (INDEPENDENT_AMBULATORY_CARE_PROVIDER_SITE_OTHER): Payer: BC Managed Care – PPO | Admitting: Obstetrics & Gynecology

## 2022-09-28 VITALS — BP 126/49 | HR 89 | Wt 288.0 lb

## 2022-09-28 DIAGNOSIS — O0933 Supervision of pregnancy with insufficient antenatal care, third trimester: Secondary | ICD-10-CM | POA: Diagnosis not present

## 2022-09-28 DIAGNOSIS — Z3480 Encounter for supervision of other normal pregnancy, unspecified trimester: Secondary | ICD-10-CM

## 2022-09-28 DIAGNOSIS — O0930 Supervision of pregnancy with insufficient antenatal care, unspecified trimester: Secondary | ICD-10-CM

## 2022-09-28 DIAGNOSIS — Z3A29 29 weeks gestation of pregnancy: Secondary | ICD-10-CM

## 2022-09-28 DIAGNOSIS — O99213 Obesity complicating pregnancy, third trimester: Secondary | ICD-10-CM | POA: Diagnosis not present

## 2022-09-28 DIAGNOSIS — Z369 Encounter for antenatal screening, unspecified: Secondary | ICD-10-CM

## 2022-09-28 DIAGNOSIS — Z3A38 38 weeks gestation of pregnancy: Secondary | ICD-10-CM | POA: Diagnosis not present

## 2022-09-28 DIAGNOSIS — E669 Obesity, unspecified: Secondary | ICD-10-CM | POA: Diagnosis not present

## 2022-09-28 DIAGNOSIS — Z3A00 Weeks of gestation of pregnancy not specified: Secondary | ICD-10-CM | POA: Insufficient documentation

## 2022-09-28 DIAGNOSIS — O09523 Supervision of elderly multigravida, third trimester: Secondary | ICD-10-CM

## 2022-09-28 DIAGNOSIS — Z641 Problems related to multiparity: Secondary | ICD-10-CM | POA: Diagnosis not present

## 2022-09-28 LAB — POCT URINALYSIS DIPSTICK
Bilirubin, UA: NEGATIVE
Blood, UA: NEGATIVE
Glucose, UA: NEGATIVE
Ketones, UA: NEGATIVE
Leukocytes, UA: NEGATIVE
Nitrite, UA: NEGATIVE
Protein, UA: NEGATIVE
Spec Grav, UA: 1.02 (ref 1.010–1.025)
Urobilinogen, UA: 0.2 E.U./dL — AB
pH, UA: 7 (ref 5.0–8.0)

## 2022-09-28 LAB — CERVICOVAGINAL ANCILLARY ONLY
Chlamydia: NEGATIVE
Comment: NEGATIVE
Comment: NORMAL
Neisseria Gonorrhea: NEGATIVE

## 2022-09-28 NOTE — Progress Notes (Signed)
  Subjective:    Laurie Floyd is being seen today for her first obstetrical visit.  This is not a planned pregnancy, but it is desired.  She is at [redacted]w[redacted]d gestation. Her obstetrical history is significant for advanced maternal age, obesity, and late prenatal care, and grand multipara. Relationship with FOB: spouse, living together. Patient does intend to breast feed. She is still breastfeeding her 34 year old. She delivered her last 2 children at home with her husband in attendance. She intends to do that again with this pregnancy. She declines contraception postpartum. She declines genetic testing, is aware of the increased risk due to her age (will be 37 at the time of delivery. Pregnancy history fully reviewed.  Patient reports no complaints. She reports good FM, denies bleeding, ROM and contractions.   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  Objective:     BP (!) 126/49   Pulse 89   Wt 288 lb (130.6 kg)   LMP 03/09/2022 (Approximate)   BMI 45.11 kg/m  Physical Exam  Exam Well nourished, well hydrated White female, no apparent distress She is ambulating and conversing normally. General:  alert   Breasts:  inspection negative, no nipple discharge or bleeding, no masses or nodularity palpable  Lungs: clear to auscultation bilaterally  Heart:  regular rate and rhythm, S1, S2 normal, no murmur, click, rub or gallop  Abdomen: soft, non-tender; bowel sounds normal; no masses,  no organomegaly   Vulva:  normal  Vagina: normal  Cervix:  No lesions, normal discharge  Corpus: 16 week size  Adnexa:  not enlarged or painful  Rectal Exam: Not performed.        Assessment:    Pregnancy: [id number] Patient Active Problem List   Diagnosis Date Noted   Grand multipara 09/28/2022   Multigravida of advanced maternal age in third trimester 09/28/2022   Supervision of other normal pregnancy, antepartum 09/24/2022   Obesity in pregnancy,  antepartum, third trimester 08/11/2019   Limited prenatal care in third trimester 08/11/2019   BMI 40.0-44.9, adult (HCC) 06/20/2017   Late prenatal care 06/20/2017   Anxiety disorder 07/31/2016   Muscle spasm of back 07/31/2016   Migraine without aura and without status migrainosus, not intractable 06/08/2016   Bilateral plantar fasciitis 06/08/2016   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 Prenatal vitamins. Problem list reviewed and updated. AFP3 discussed: declined. Role of ultrasound in pregnancy discussed; fetal survey: already done last week. Amniocentesis discussed: declined. Follow up in 2 weeks. Pap smear with reflex HPV testing done along with cervical cultures  Laurie Floyd 09/28/2022

## 2022-09-29 LAB — CYTOLOGY - PAP
Chlamydia: NEGATIVE
Comment: NEGATIVE
Comment: NORMAL
Diagnosis: NEGATIVE
Neisseria Gonorrhea: NEGATIVE

## 2022-09-29 LAB — CBC/D/PLT+RPR+RH+ABO+RUBIGG...
Antibody Screen: NEGATIVE
Basophils Absolute: 0 10*3/uL (ref 0.0–0.2)
Basos: 0 %
EOS (ABSOLUTE): 0.1 10*3/uL (ref 0.0–0.4)
Eos: 2 %
HCV Ab: NONREACTIVE
HIV Screen 4th Generation wRfx: NONREACTIVE
Hematocrit: 34.5 % (ref 34.0–46.6)
Hemoglobin: 11.2 g/dL (ref 11.1–15.9)
Hepatitis B Surface Ag: NEGATIVE
Immature Grans (Abs): 0.1 10*3/uL (ref 0.0–0.1)
Immature Granulocytes: 1 %
Lymphocytes Absolute: 1 10*3/uL (ref 0.7–3.1)
Lymphs: 17 %
MCH: 28.1 pg (ref 26.6–33.0)
MCHC: 32.5 g/dL (ref 31.5–35.7)
MCV: 87 fL (ref 79–97)
Monocytes Absolute: 0.3 10*3/uL (ref 0.1–0.9)
Monocytes: 6 %
Neutrophils Absolute: 4.2 10*3/uL (ref 1.4–7.0)
Neutrophils: 74 %
Platelets: 179 10*3/uL (ref 150–450)
RBC: 3.98 x10E6/uL (ref 3.77–5.28)
RDW: 13.2 % (ref 11.7–15.4)
RPR Ser Ql: NONREACTIVE
Rh Factor: POSITIVE
Rubella Antibodies, IGG: 4.52 index (ref >0.99)
Varicella zoster IgG: 1328 index (ref >165)
WBC: 5.7 10*3/uL (ref 3.4–10.8)

## 2022-09-29 LAB — GLUCOSE, 1 HOUR GESTATIONAL: Gestational Diabetes Screen: 116 mg/dL (ref 70–139)

## 2022-09-29 LAB — HCV INTERPRETATION

## 2022-10-15 ENCOUNTER — Ambulatory Visit (INDEPENDENT_AMBULATORY_CARE_PROVIDER_SITE_OTHER): Payer: BC Managed Care – PPO | Admitting: Certified Nurse Midwife

## 2022-10-15 ENCOUNTER — Encounter: Payer: Self-pay | Admitting: Certified Nurse Midwife

## 2022-10-15 VITALS — BP 115/80 | HR 73 | Wt 291.7 lb

## 2022-10-15 DIAGNOSIS — E669 Obesity, unspecified: Secondary | ICD-10-CM

## 2022-10-15 DIAGNOSIS — Z3A31 31 weeks gestation of pregnancy: Secondary | ICD-10-CM

## 2022-10-15 DIAGNOSIS — O99213 Obesity complicating pregnancy, third trimester: Secondary | ICD-10-CM

## 2022-10-15 DIAGNOSIS — O99210 Obesity complicating pregnancy, unspecified trimester: Secondary | ICD-10-CM

## 2022-10-15 LAB — POCT URINALYSIS DIPSTICK OB
Bilirubin, UA: NEGATIVE
Blood, UA: NEGATIVE
Glucose, UA: NEGATIVE
Ketones, UA: NEGATIVE
Leukocytes, UA: NEGATIVE
Nitrite, UA: NEGATIVE
POC,PROTEIN,UA: NEGATIVE
Spec Grav, UA: 1.01 (ref 1.010–1.025)
Urobilinogen, UA: 0.2 E.U./dL
pH, UA: 6.5 (ref 5.0–8.0)

## 2022-10-15 NOTE — Progress Notes (Signed)
ROB dong well, feeling good movement. BP elevated today, repeat 115/80. She denies any history of pre eclampsia or gestional hypertension with previous pregnancies. She denies any symptoms of pre eclampsia today. Discussed potential labs as needed if BP is elevated or she has symptoms in upcoming visits. Expressed my concern with home delivery given she has multiple risk factors ie, obesity, advance maternal age, late to prenatal care, grand multiparity. She verbalizes understanding of concerns.  Pt to follow up in 2 wk or PRN .   Growth u/s ordered with MFM due to [place], CNM

## 2022-10-22 NOTE — Telephone Encounter (Addendum)
I contacted patient via phone x2. I left message for ultrasound add on for 1/18 at 8:45 for patient to call back to confirm.

## 2022-10-29 ENCOUNTER — Other Ambulatory Visit: Payer: BC Managed Care – PPO

## 2022-10-29 ENCOUNTER — Encounter: Payer: BC Managed Care – PPO | Admitting: Obstetrics & Gynecology

## 2022-11-09 ENCOUNTER — Ambulatory Visit (INDEPENDENT_AMBULATORY_CARE_PROVIDER_SITE_OTHER): Payer: BC Managed Care – PPO | Admitting: Certified Nurse Midwife

## 2022-11-09 ENCOUNTER — Encounter: Payer: Self-pay | Admitting: Certified Nurse Midwife

## 2022-11-09 VITALS — BP 124/88 | HR 83 | Wt 289.5 lb

## 2022-11-09 DIAGNOSIS — E669 Obesity, unspecified: Secondary | ICD-10-CM

## 2022-11-09 DIAGNOSIS — O99210 Obesity complicating pregnancy, unspecified trimester: Secondary | ICD-10-CM

## 2022-11-09 DIAGNOSIS — Z3A35 35 weeks gestation of pregnancy: Secondary | ICD-10-CM

## 2022-11-09 DIAGNOSIS — O99213 Obesity complicating pregnancy, third trimester: Secondary | ICD-10-CM

## 2022-11-09 LAB — POCT URINALYSIS DIPSTICK OB
Bilirubin, UA: NEGATIVE
Blood, UA: NEGATIVE
Glucose, UA: NEGATIVE
Ketones, UA: NEGATIVE
Leukocytes, UA: NEGATIVE
Nitrite, UA: NEGATIVE
POC,PROTEIN,UA: NEGATIVE
Spec Grav, UA: 1.015 (ref 1.010–1.025)
Urobilinogen, UA: 0.2 E.U./dL
pH, UA: 6.5 (ref 5.0–8.0)

## 2022-11-09 NOTE — Progress Notes (Signed)
ROB doing well, feeling good movement. Denies any questions or concerns today. She has u/s scheduled for tomorrow. She is considering delivering with [place] at the hospital this pregnancy in light of increased risk to her /baby.   Follow up 1 wk for NST ( obesity) /ROB  Philip Aspen, CNM

## 2022-11-10 ENCOUNTER — Other Ambulatory Visit: Payer: Self-pay

## 2022-11-10 ENCOUNTER — Ambulatory Visit: Payer: BC Managed Care – PPO | Attending: Obstetrics

## 2022-11-10 VITALS — BP 138/86 | HR 81 | Temp 97.8°F | Ht 67.0 in | Wt 288.0 lb

## 2022-11-10 DIAGNOSIS — Z3A31 31 weeks gestation of pregnancy: Secondary | ICD-10-CM

## 2022-11-10 DIAGNOSIS — O0933 Supervision of pregnancy with insufficient antenatal care, third trimester: Secondary | ICD-10-CM

## 2022-11-10 DIAGNOSIS — Z3A35 35 weeks gestation of pregnancy: Secondary | ICD-10-CM | POA: Diagnosis not present

## 2022-11-10 DIAGNOSIS — E669 Obesity, unspecified: Secondary | ICD-10-CM

## 2022-11-10 DIAGNOSIS — O99210 Obesity complicating pregnancy, unspecified trimester: Secondary | ICD-10-CM

## 2022-11-10 DIAGNOSIS — O09523 Supervision of elderly multigravida, third trimester: Secondary | ICD-10-CM | POA: Diagnosis not present

## 2022-11-10 DIAGNOSIS — O99213 Obesity complicating pregnancy, third trimester: Secondary | ICD-10-CM

## 2022-11-10 DIAGNOSIS — Z3480 Encounter for supervision of other normal pregnancy, unspecified trimester: Secondary | ICD-10-CM

## 2022-11-10 DIAGNOSIS — F419 Anxiety disorder, unspecified: Secondary | ICD-10-CM

## 2022-11-10 DIAGNOSIS — O99343 Other mental disorders complicating pregnancy, third trimester: Secondary | ICD-10-CM

## 2022-11-17 ENCOUNTER — Ambulatory Visit (INDEPENDENT_AMBULATORY_CARE_PROVIDER_SITE_OTHER): Payer: BC Managed Care – PPO | Admitting: Obstetrics

## 2022-11-17 ENCOUNTER — Other Ambulatory Visit (HOSPITAL_COMMUNITY)
Admission: RE | Admit: 2022-11-17 | Discharge: 2022-11-17 | Disposition: A | Discharge status: Home / Self Care | Payer: BC Managed Care – PPO | Source: Ambulatory Visit | Attending: Obstetrics | Admitting: Obstetrics

## 2022-11-17 VITALS — BP 132/95 | HR 88 | Wt 288.0 lb

## 2022-11-17 DIAGNOSIS — Z113 Encounter for screening for infections with a predominantly sexual mode of transmission: Secondary | ICD-10-CM | POA: Diagnosis present

## 2022-11-17 DIAGNOSIS — Z641 Problems related to multiparity: Secondary | ICD-10-CM

## 2022-11-17 DIAGNOSIS — Z3A36 36 weeks gestation of pregnancy: Secondary | ICD-10-CM

## 2022-11-17 DIAGNOSIS — O0933 Supervision of pregnancy with insufficient antenatal care, third trimester: Secondary | ICD-10-CM

## 2022-11-17 DIAGNOSIS — O0930 Supervision of pregnancy with insufficient antenatal care, unspecified trimester: Secondary | ICD-10-CM

## 2022-11-17 DIAGNOSIS — O99213 Obesity complicating pregnancy, third trimester: Secondary | ICD-10-CM

## 2022-11-17 DIAGNOSIS — Z3480 Encounter for supervision of other normal pregnancy, unspecified trimester: Secondary | ICD-10-CM

## 2022-11-17 DIAGNOSIS — Z3685 Encounter for antenatal screening for Streptococcus B: Secondary | ICD-10-CM

## 2022-11-17 LAB — POCT URINALYSIS DIPSTICK OB
Bilirubin, UA: NEGATIVE
Blood, UA: NEGATIVE
Glucose, UA: NEGATIVE
Ketones, UA: NEGATIVE
Leukocytes, UA: NEGATIVE
Nitrite, UA: NEGATIVE
POC,PROTEIN,UA: NEGATIVE
Spec Grav, UA: 1.02 (ref 1.010–1.025)
Urobilinogen, UA: 0.2 E.U./dL
pH, UA: 7 (ref 5.0–8.0)

## 2022-11-17 NOTE — Progress Notes (Signed)
ROB at [redacted]w[redacted]d. Active baby. Having some ctx. Reviewed normal growth [place] with MFM. Discussed weekly NSTs. Laurie Floyd is in agreement. She plans to birth at [facility] this time. Reviewed when to go the hospital. GBS and GC/chlamydia self-collected. RTC in one week.  Lurlean Horns, CNM

## 2022-11-19 LAB — STREP GP B NAA: Strep Gp B NAA: POSITIVE — AB

## 2022-11-20 LAB — CERVICOVAGINAL ANCILLARY ONLY
Chlamydia: NEGATIVE
Comment: NEGATIVE
Comment: NORMAL
Neisseria Gonorrhea: NEGATIVE

## 2022-11-25 DIAGNOSIS — Z3A37 37 weeks gestation of pregnancy: Secondary | ICD-10-CM | POA: Insufficient documentation

## 2022-11-25 NOTE — Progress Notes (Deleted)
    NURSE VISIT NOTE  Subjective:    Patient ID: Laurie Floyd, female    DOB: 17-Mar-1986, 37 y.o.   MRN: [id number]  HPI  Patient is a 37 y.o. [id number] female who presents for fetal monitoring per order from Lloyd Huger, CNM.   Objective:    LMP 03/09/2022 (Approximate)  Estimated Date of Delivery: 12/14/22  Assessment:   1. Obesity in pregnancy, antepartum, third trimester   2. [redacted] weeks gestation of pregnancy      Plan:   Results reviewed and discussed with patient by  Philip Aspen, CNM.     Otila Kluver, LPN

## 2022-11-25 NOTE — Patient Instructions (Incomplete)

## 2022-11-27 ENCOUNTER — Other Ambulatory Visit: Payer: BC Managed Care – PPO

## 2022-11-27 ENCOUNTER — Encounter: Payer: BC Managed Care – PPO | Admitting: Certified Nurse Midwife

## 2022-11-27 DIAGNOSIS — O99213 Obesity complicating pregnancy, third trimester: Secondary | ICD-10-CM

## 2022-11-27 DIAGNOSIS — Z3A37 37 weeks gestation of pregnancy: Secondary | ICD-10-CM

## 2022-12-03 ENCOUNTER — Ambulatory Visit (INDEPENDENT_AMBULATORY_CARE_PROVIDER_SITE_OTHER): Payer: BC Managed Care – PPO | Admitting: Obstetrics and Gynecology

## 2022-12-03 ENCOUNTER — Encounter: Payer: Self-pay | Admitting: Obstetrics and Gynecology

## 2022-12-03 ENCOUNTER — Ambulatory Visit: Payer: BC Managed Care – PPO

## 2022-12-03 VITALS — BP 134/92 | HR 79 | Ht 67.0 in | Wt 290.9 lb

## 2022-12-03 VITALS — BP 134/92 | HR 79 | Wt 290.9 lb

## 2022-12-03 DIAGNOSIS — O99213 Obesity complicating pregnancy, third trimester: Secondary | ICD-10-CM

## 2022-12-03 DIAGNOSIS — E669 Obesity, unspecified: Secondary | ICD-10-CM | POA: Diagnosis not present

## 2022-12-03 DIAGNOSIS — Z3A38 38 weeks gestation of pregnancy: Secondary | ICD-10-CM | POA: Diagnosis not present

## 2022-12-03 DIAGNOSIS — O09523 Supervision of elderly multigravida, third trimester: Secondary | ICD-10-CM

## 2022-12-03 DIAGNOSIS — Z3480 Encounter for supervision of other normal pregnancy, unspecified trimester: Secondary | ICD-10-CM

## 2022-12-03 DIAGNOSIS — Z641 Problems related to multiparity: Secondary | ICD-10-CM | POA: Diagnosis not present

## 2022-12-03 NOTE — Patient Instructions (Signed)

## 2022-12-03 NOTE — Progress Notes (Signed)
    NURSE VISIT NOTE  Subjective:    Patient ID: Laurie Floyd, female    DOB: April 11, 1986, 37 y.o.   MRN: [id number]  HPI  Patient is a 37 y.o. [id number] female who presents for fetal monitoring per order from Lloyd Huger, CNM.   Objective:    BP (!) 143/90   Pulse 92   Ht 5' 7"$  (1.702 m)   Wt 290 lb 14.4 oz (132 kg)   LMP 03/09/2022 (Approximate)   BMI 45.56 kg/m  Estimated Date of Delivery: 12/14/22  Assessment:   1. Obesity in pregnancy, antepartum, third trimester   2. Multigravida of advanced maternal age in third trimester   3. [redacted] weeks gestation of pregnancy      Plan:   Results reviewed and discussed with patient by  Laurie Fend, MD.     Otila Kluver, LPN

## 2022-12-03 NOTE — Progress Notes (Signed)
ROB [redacted]w[redacted]d NST. Patient reports good fetal movement, denies pain/pressure, vaginal bleeding or discharge. No concerns at this time.

## 2022-12-03 NOTE — Progress Notes (Signed)
ROB: Patient reports she is doing well.  Denies contractions.  NST today reactive.  Her BMI is now over 45-anesthesia referral sent and scheduled.  Patient plans on keeping that appointment.  We have discussed the increased risk of fetal complications after 40 weeks and elevated BMI.  Patient declines induction at this time.  She would like to go past her due date if she is not in spontaneous labor.  She would like to be induced prior to 41 weeks however.  Consider scheduling at next visit.  Signs and symptoms of labor discussed.

## 2022-12-04 ENCOUNTER — Other Ambulatory Visit: Payer: BC Managed Care – PPO

## 2022-12-07 ENCOUNTER — Encounter: Payer: Self-pay | Admitting: Obstetrics and Gynecology

## 2022-12-09 ENCOUNTER — Other Ambulatory Visit: Payer: BC Managed Care – PPO

## 2022-12-10 ENCOUNTER — Encounter: Payer: BC Managed Care – PPO | Admitting: Licensed Practical Nurse

## 2022-12-10 ENCOUNTER — Other Ambulatory Visit: Payer: BC Managed Care – PPO

## 2022-12-17 ENCOUNTER — Telehealth: Payer: Self-pay

## 2022-12-17 NOTE — Telephone Encounter (Signed)
Laurie Floyd called triage line asked to speak to me, she was returning a message when I was filling out her FMLA forms, I didn't see a hospital discharge but found where she delivered at home.

## 2023-01-18 NOTE — Progress Notes (Deleted)
   OBSTETRICS POSTPARTUM CLINIC PROGRESS NOTE  Subjective:     Laurie Floyd is a 37 y.o. [phone] female who presents for a postpartum visit. She is {1-10:13787} {time; units:18646} postpartum following a {delivery:12449}. I have fully reviewed the prenatal and intrapartum course. The delivery was at *** gestational weeks.  Anesthesia: {anesthesia types:812}. Postpartum course has been ***. Baby's course has been ***. Baby is feeding by {breast/bottle:69}. Bleeding: patient {HAS HAS QKM:63817} not resumed menses, with Patient's last menstrual period was 03/09/2022 (approximate).. Bowel function is {normal:32111}. Bladder function is {normal:32111}. Patient {is/is not:9024} sexually active. Contraception method desired is {contraceptive method:5051}. Postpartum depression screening: {neg default:13464::"negative"}.  EDPS score is ***.    The following portions of the patient's history were reviewed and updated as appropriate: allergies, current medications, past family history, past medical history, past social history, past surgical history, and problem list.  Review of Systems {ros; complete:30496}   Objective:    LMP 03/09/2022 (Approximate)   General:  alert and no distress   Breasts:  inspection negative, no nipple discharge or bleeding, no masses or nodularity palpable  Lungs: clear to auscultation bilaterally  Heart:  regular rate and rhythm, S1, S2 normal, no murmur, click, rub or gallop  Abdomen: soft, non-tender; bowel sounds normal; no masses,  no organomegaly.  ***Well healed Pfannenstiel incision   Vulva:  normal  Vagina: normal vagina, no discharge, exudate, lesion, or erythema  Cervix:  no cervical motion tenderness and no lesions  Corpus: normal size, contour, position, consistency, mobility, non-tender  Adnexa:  normal adnexa and no mass, fullness, tenderness  Rectal Exam: Not performed.         Labs:  Lab Results  Component Value Date   HGB 11.2 09/28/2022      Assessment:   No diagnosis found.   Plan:    1. Contraception: {method:5051} 2. Will check Hgb for h/o postpartum anemia of less than 10.  3. Follow up in: {1-10:13787} {time; units:19136} or as needed.    Hildred Laser, MD [facility]

## 2023-01-20 ENCOUNTER — Ambulatory Visit: Payer: BC Managed Care – PPO | Admitting: Obstetrics and Gynecology

## 2023-02-04 ENCOUNTER — Telehealth: Payer: Self-pay | Admitting: Obstetrics and Gynecology

## 2023-02-04 NOTE — Telephone Encounter (Signed)
Reached out to pt to reschedule PP visit that was scheduled on 01/20/23 with Dr. Valentino Saxon at 9:35.  Left message for pt to call back.

## 2023-02-05 NOTE — Telephone Encounter (Signed)
Pt is scheduled with Pattricia Boss on 02/10/2023 at 1:15 for her 6 week PP visit.

## 2023-02-10 ENCOUNTER — Encounter: Payer: Self-pay | Admitting: Certified Nurse Midwife

## 2023-02-10 ENCOUNTER — Ambulatory Visit (INDEPENDENT_AMBULATORY_CARE_PROVIDER_SITE_OTHER): Payer: BC Managed Care – PPO | Admitting: Certified Nurse Midwife

## 2023-02-10 NOTE — Progress Notes (Signed)
Subjective:    Laurie Floyd is a 37 y.o. [phone] Caucasian female who presents for a postpartum visit. She is 6 weeks postpartum following a spontaneous vaginal delivery ,term  athome. Anesthesia: none. I have fully reviewed the prenatal and intrapartum course. Postpartum course has been normal. Baby's course has been normal. Baby is feeding by breast. Bleeding no bleeding. Bowel function is normal. Bladder function is normal. Patient is sexually active.  Contraception method is none. Postpartum depression screening: negative. Score 1.  Last pap 09/28/2022 and was negative.  The following portions of the patient's history were reviewed and updated as appropriate: allergies, current medications, past medical history, past surgical history and problem list.  Review of Systems Pertinent items are noted in HPI.   There were no vitals filed for this visit. Patient's last menstrual period was 03/09/2022 (approximate).  Objective:   General:  alert, cooperative and no distress   Breasts:  deferred, no complaints  Lungs: clear to auscultation bilaterally  Heart:  regular rate and rhythm  Abdomen: soft, nontender   Vulva: normal  Vagina: normal vagina  Cervix:  closed  Corpus: Well-involuted  Adnexa:  Non-palpable  Rectal Exam: no hemorrhoids        Assessment:   Postpartum exam 6 wks s/p SVD Breast feeding Depression screening Contraception counseling   Plan:  : none, she declines  Follow up in: 6 months for annual or earlier if needed  Doreene Burke, CNM

## 2023-02-10 NOTE — Patient Instructions (Signed)

## 2023-12-06 ENCOUNTER — Telehealth: Payer: Self-pay

## 2023-12-06 NOTE — Telephone Encounter (Signed)
 Laurie Floyd with [facility] calling to get details of patient care and delivery. She has reviewed records in [facility]. Clarified of patient first appointment, LMP, number of visits. HCV lab results and postpartum visit. No birth weight available as patient delivered at home.

## 2024-01-07 ENCOUNTER — Ambulatory Visit (INDEPENDENT_AMBULATORY_CARE_PROVIDER_SITE_OTHER): Admitting: Certified Nurse Midwife

## 2024-01-07 VITALS — BP 121/66 | HR 88 | Ht 67.0 in | Wt 292.4 lb

## 2024-01-07 DIAGNOSIS — Z3A01 Less than 8 weeks gestation of pregnancy: Secondary | ICD-10-CM

## 2024-01-07 DIAGNOSIS — O039 Complete or unspecified spontaneous abortion without complication: Secondary | ICD-10-CM | POA: Diagnosis not present

## 2024-01-07 NOTE — Progress Notes (Signed)
    GYNECOLOGY PROGRESS NOTE  Subjective:    Patient ID: Laurie Floyd, female    DOB: 1986/09/11, 38 y.o.   MRN: [id number]  HPI  Patient is a 38 y.o. [id number] female who presents for evaluation after heavier than usual last cycle. She had a positive pregnancy test and then began her period. It had a lot of clots and was more painful than her regular cycles. She stopped bleeding yesterday.    Review of Systems Pertinent items are noted in HPI.   Objective:   Blood pressure 121/66, pulse 88, height 5\' 7"  (1.702 m), weight 292 lb 6.4 oz (132.6 kg), last menstrual period 01/02/2024, currently breastfeeding. Body mass index is 45.8 kg/m. General appearance: alert Abdomen: soft, non-tender; bowel sounds normal; no masses,  no organomegaly   Assessment:   1. Spontaneous abortion      Plan:   1. Spontaneous abortion (Primary) -Based on history, likely early miscarriage. Will follow HCGs until below 5. Bleeding precautions given.  - Beta hCG quant (ref lab); Future - Beta hCG quant (ref lab)

## 2024-01-08 LAB — BETA HCG QUANT (REF LAB): hCG Quant: <1 m[IU]/mL

## 2024-01-21 ENCOUNTER — Other Ambulatory Visit

## 2024-11-24 ENCOUNTER — Encounter: Admitting: Licensed Practical Nurse
